# Patient Record
Sex: Male | Born: 1935 | Race: Black or African American | Hispanic: No | Smoking: Former smoker
Health system: Southern US, Community
[De-identification: ages and names within clinical notes are randomized; demographics above are authoritative.]

## PROBLEM LIST (undated history)

## (undated) DIAGNOSIS — N400 Enlarged prostate without lower urinary tract symptoms: Secondary | ICD-10-CM

## (undated) DIAGNOSIS — M1990 Unspecified osteoarthritis, unspecified site: Secondary | ICD-10-CM

## (undated) DIAGNOSIS — I10 Essential (primary) hypertension: Secondary | ICD-10-CM

## (undated) DIAGNOSIS — N19 Unspecified kidney failure: Secondary | ICD-10-CM

## (undated) DIAGNOSIS — E785 Hyperlipidemia, unspecified: Secondary | ICD-10-CM

## (undated) HISTORY — DX: Hyperlipidemia, unspecified: E78.5

## (undated) HISTORY — DX: Unspecified kidney failure: N19

## (undated) HISTORY — DX: Unspecified osteoarthritis, unspecified site: M19.90

## (undated) HISTORY — PX: TOTAL HIP ARTHROPLASTY: SHX124

---

## 2010-06-28 ENCOUNTER — Ambulatory Visit: Payer: Self-pay | Admitting: Unknown Physician Specialty

## 2010-07-12 ENCOUNTER — Inpatient Hospital Stay: Payer: Self-pay | Admitting: Unknown Physician Specialty

## 2010-07-17 ENCOUNTER — Encounter: Payer: Self-pay | Admitting: Internal Medicine

## 2010-08-02 ENCOUNTER — Encounter: Payer: Self-pay | Admitting: Internal Medicine

## 2011-11-21 ENCOUNTER — Emergency Department: Payer: Self-pay | Admitting: Internal Medicine

## 2011-11-21 LAB — COMPREHENSIVE METABOLIC PANEL
Albumin: 3.6 g/dL (ref 3.4–5.0)
Alkaline Phosphatase: 85 U/L (ref 50–136)
Anion Gap: 9 (ref 7–16)
Bilirubin,Total: 1 mg/dL (ref 0.2–1.0)
Chloride: 105 mmol/L (ref 98–107)
Co2: 29 mmol/L (ref 21–32)
Creatinine: 1.13 mg/dL (ref 0.60–1.30)
EGFR (Non-African Amer.): >60
Osmolality: 290 (ref 275–301)
Potassium: 2.9 mmol/L — ABNORMAL LOW (ref 3.5–5.1)
SGOT(AST): 22 U/L (ref 15–37)
Sodium: 143 mmol/L (ref 136–145)
Total Protein: 8.4 g/dL — ABNORMAL HIGH (ref 6.4–8.2)

## 2011-11-21 LAB — URINALYSIS, COMPLETE
Bilirubin,UR: NEGATIVE
Glucose,UR: NEGATIVE mg/dL (ref 0–75)
Nitrite: NEGATIVE
Specific Gravity: 1.015 (ref 1.003–1.030)
Squamous Epithelial: NONE SEEN

## 2011-11-21 LAB — CBC WITH DIFFERENTIAL/PLATELET
Basophil %: 0.1 %
HCT: 42.3 % (ref 40.0–52.0)
HGB: 14.2 g/dL (ref 13.0–18.0)
Lymphocyte #: 0.4 10*3/uL — ABNORMAL LOW (ref 1.0–3.6)
MCH: 30.1 pg (ref 26.0–34.0)
MCHC: 33.7 g/dL (ref 32.0–36.0)
MCV: 90 fL (ref 80–100)
Monocyte %: 4.5 %
Neutrophil #: 7.8 10*3/uL — ABNORMAL HIGH (ref 1.4–6.5)
Neutrophil %: 90.7 %
Platelet: 101 10*3/uL — ABNORMAL LOW (ref 150–440)
RDW: 14.8 % — ABNORMAL HIGH (ref 11.5–14.5)
WBC: 8.6 10*3/uL (ref 3.8–10.6)

## 2011-11-23 LAB — URINE CULTURE

## 2011-11-27 LAB — CULTURE, BLOOD (SINGLE)

## 2012-01-27 ENCOUNTER — Ambulatory Visit: Payer: Self-pay | Admitting: Oncology

## 2012-01-27 LAB — CBC CANCER CENTER
Basophil #: 0 x10 3/mm (ref 0.0–0.1)
Basophil %: 0.4 %
Eosinophil #: 0.3 x10 3/mm (ref 0.0–0.7)
Lymphocyte %: 30.5 %
MCH: 29.4 pg (ref 26.0–34.0)
MCHC: 32.5 g/dL (ref 32.0–36.0)
Neutrophil %: 58 %
Platelet: 95 x10 3/mm — ABNORMAL LOW (ref 150–440)
RDW: 14.8 % — ABNORMAL HIGH (ref 11.5–14.5)

## 2012-01-27 LAB — IRON AND TIBC
Iron: 83 ug/dL (ref 65–175)
Unbound Iron-Bind.Cap.: 227 ug/dL

## 2012-01-27 LAB — FOLATE: Folic Acid: 17.5 ng/mL (ref 3.1–100.0)

## 2012-01-27 LAB — FERRITIN: Ferritin (ARMC): 261 ng/mL (ref 8–388)

## 2012-01-27 LAB — LACTATE DEHYDROGENASE: LDH: 194 U/L (ref 81–234)

## 2012-02-02 ENCOUNTER — Ambulatory Visit: Payer: Self-pay | Admitting: Oncology

## 2012-04-28 ENCOUNTER — Ambulatory Visit: Payer: Self-pay | Admitting: Oncology

## 2012-04-28 LAB — CBC CANCER CENTER
Basophil #: 0.1 x10 3/mm (ref 0.0–0.1)
Basophil %: 0.8 %
Eosinophil #: 0.1 x10 3/mm (ref 0.0–0.7)
HGB: 15.4 g/dL (ref 13.0–18.0)
Lymphocyte %: 39.2 %
MCHC: 35.4 g/dL (ref 32.0–36.0)
MCV: 90 fL (ref 80–100)
Monocyte %: 6.5 %
Neutrophil %: 51.6 %
RBC: 4.83 10*6/uL (ref 4.40–5.90)
RDW: 14.4 % (ref 11.5–14.5)

## 2012-05-03 ENCOUNTER — Ambulatory Visit: Payer: Self-pay | Admitting: Oncology

## 2012-05-06 ENCOUNTER — Emergency Department: Payer: Self-pay | Admitting: Emergency Medicine

## 2012-05-06 LAB — COMPREHENSIVE METABOLIC PANEL
Alkaline Phosphatase: 71 U/L (ref 50–136)
Anion Gap: 10 (ref 7–16)
BUN: 23 mg/dL — ABNORMAL HIGH (ref 7–18)
Bilirubin,Total: 1.2 mg/dL — ABNORMAL HIGH (ref 0.2–1.0)
Calcium, Total: 8.7 mg/dL (ref 8.5–10.1)
Chloride: 108 mmol/L — ABNORMAL HIGH (ref 98–107)
Co2: 23 mmol/L (ref 21–32)
Creatinine: 1.08 mg/dL (ref 0.60–1.30)
EGFR (African American): >60
EGFR (Non-African Amer.): >60
Osmolality: 285 (ref 275–301)
Potassium: 3.1 mmol/L — ABNORMAL LOW (ref 3.5–5.1)
SGPT (ALT): 17 U/L (ref 12–78)
Total Protein: 7.7 g/dL (ref 6.4–8.2)

## 2012-05-06 LAB — CBC
HCT: 40.8 % (ref 40.0–52.0)
HGB: 13.5 g/dL (ref 13.0–18.0)
RBC: 4.54 10*6/uL (ref 4.40–5.90)
WBC: 9.8 10*3/uL (ref 3.8–10.6)

## 2012-05-06 LAB — URINALYSIS, COMPLETE
Bilirubin,UR: NEGATIVE
Glucose,UR: NEGATIVE mg/dL (ref 0–75)
Nitrite: NEGATIVE
Ph: 8 (ref 4.5–8.0)
Specific Gravity: 1.017 (ref 1.003–1.030)

## 2012-05-06 LAB — RAPID INFLUENZA A&B ANTIGENS

## 2012-07-04 ENCOUNTER — Ambulatory Visit: Payer: Self-pay | Admitting: Oncology

## 2012-10-15 ENCOUNTER — Ambulatory Visit: Payer: Self-pay | Admitting: Oncology

## 2012-11-18 ENCOUNTER — Ambulatory Visit: Payer: Self-pay | Admitting: Orthopedic Surgery

## 2012-11-18 LAB — SEDIMENTATION RATE: Erythrocyte Sed Rate: 8 mm/hr (ref 0–20)

## 2012-11-18 LAB — URINALYSIS, COMPLETE
Bilirubin,UR: NEGATIVE
Blood: NEGATIVE
Glucose,UR: NEGATIVE mg/dL (ref 0–75)
Ketone: NEGATIVE
Ph: 6 (ref 4.5–8.0)
RBC,UR: 1 /HPF (ref 0–5)

## 2012-11-18 LAB — BASIC METABOLIC PANEL
Anion Gap: 7 (ref 7–16)
Chloride: 106 mmol/L (ref 98–107)
Co2: 29 mmol/L (ref 21–32)
Creatinine: 1.11 mg/dL (ref 0.60–1.30)
EGFR (African American): >60
EGFR (Non-African Amer.): >60
Glucose: 89 mg/dL (ref 65–99)
Osmolality: 285 (ref 275–301)
Potassium: 3.3 mmol/L — ABNORMAL LOW (ref 3.5–5.1)
Sodium: 142 mmol/L (ref 136–145)

## 2012-11-18 LAB — MRSA PCR SCREENING

## 2012-11-18 LAB — PROTIME-INR
INR: 1
Prothrombin Time: 13.5 secs (ref 11.5–14.7)

## 2012-11-18 LAB — CBC
MCV: 89 fL (ref 80–100)
Platelet: 108 10*3/uL — ABNORMAL LOW (ref 150–440)
RBC: 4.73 10*6/uL (ref 4.40–5.90)
WBC: 5 10*3/uL (ref 3.8–10.6)

## 2012-12-03 ENCOUNTER — Inpatient Hospital Stay: Payer: Self-pay | Admitting: Orthopedic Surgery

## 2012-12-04 LAB — BASIC METABOLIC PANEL
Anion Gap: 9 (ref 7–16)
BUN: 13 mg/dL (ref 7–18)
Co2: 25 mmol/L (ref 21–32)
Creatinine: 1.08 mg/dL (ref 0.60–1.30)
EGFR (Non-African Amer.): >60
Potassium: 3.3 mmol/L — ABNORMAL LOW (ref 3.5–5.1)

## 2012-12-04 LAB — HEMOGLOBIN: HGB: 10.6 g/dL — ABNORMAL LOW (ref 13.0–18.0)

## 2012-12-05 LAB — URINALYSIS, COMPLETE
Glucose,UR: NEGATIVE mg/dL (ref 0–75)
Nitrite: NEGATIVE
Ph: 6 (ref 4.5–8.0)
Protein: 100
Specific Gravity: 1.017 (ref 1.003–1.030)
Squamous Epithelial: 35
WBC UR: 1 /HPF (ref 0–5)

## 2012-12-05 LAB — BASIC METABOLIC PANEL
Anion Gap: 9 (ref 7–16)
BUN: 9 mg/dL (ref 7–18)
Chloride: 106 mmol/L (ref 98–107)
Co2: 26 mmol/L (ref 21–32)
Creatinine: 0.99 mg/dL (ref 0.60–1.30)
EGFR (African American): >60
EGFR (Non-African Amer.): >60
Glucose: 81 mg/dL (ref 65–99)
Osmolality: 279 (ref 275–301)

## 2012-12-05 LAB — HEMOGLOBIN: HGB: 10.4 g/dL — ABNORMAL LOW (ref 13.0–18.0)

## 2012-12-05 LAB — PLATELET COUNT: Platelet: 79 10*3/uL — ABNORMAL LOW (ref 150–440)

## 2012-12-06 LAB — BASIC METABOLIC PANEL
Anion Gap: 9 (ref 7–16)
BUN: 10 mg/dL (ref 7–18)
Chloride: 106 mmol/L (ref 98–107)
Creatinine: 1.01 mg/dL (ref 0.60–1.30)
EGFR (Non-African Amer.): >60
Osmolality: 279 (ref 275–301)
Potassium: 3.2 mmol/L — ABNORMAL LOW (ref 3.5–5.1)

## 2012-12-07 LAB — BASIC METABOLIC PANEL
Anion Gap: 6 — ABNORMAL LOW (ref 7–16)
BUN: 10 mg/dL (ref 7–18)
Calcium, Total: 8.3 mg/dL — ABNORMAL LOW (ref 8.5–10.1)
Co2: 27 mmol/L (ref 21–32)
Creatinine: 0.93 mg/dL (ref 0.60–1.30)
EGFR (African American): >60
EGFR (Non-African Amer.): >60
Glucose: 87 mg/dL (ref 65–99)
Osmolality: 276 (ref 275–301)
Potassium: 3.2 mmol/L — ABNORMAL LOW (ref 3.5–5.1)
Sodium: 139 mmol/L (ref 136–145)

## 2012-12-07 LAB — URINE CULTURE

## 2012-12-08 LAB — PATHOLOGY REPORT

## 2014-08-21 ENCOUNTER — Inpatient Hospital Stay: Payer: Self-pay | Admitting: Internal Medicine

## 2014-08-29 DIAGNOSIS — I639 Cerebral infarction, unspecified: Secondary | ICD-10-CM | POA: Insufficient documentation

## 2014-09-23 NOTE — Discharge Summary (Signed)
PATIENT NAME:  Edwin Sutton, Edwin Sutton MR#:  [id number] DATE OF BIRTH:  [birth year]  DATE OF ADMISSION:  12/03/2012 DATE OF DISCHARGE:  12/07/2012  ADMITTING DIAGNOSIS: Degenerative arthritis, left hip, severe.   DISCHARGE DIAGNOSIS: Degenerative arthritis, left hip, severe.   OPERATION: On 12/03/2012, he had a left total hip arthroplasty.   SURGEON: Dr. Kennedy BuckerMichael Menz.   ANESTHESIA: Spinal.   ESTIMATED BLOOD LOSS: 1000 mL.   DRAINS: Hemovac.   IMPLANTS: Medacta 2 AMIS stem, 56 mm Versa Fit cup with DM liner, L 28 mm head.   COMPLICATIONS: None.   HISTORY: Mr. Edwin Sutton is an 81 year old African American male with progressive left hip pain. He failed conservative measures and consented for total hip arthroplasty.   PHYSICAL EXAMINATION:  GENERAL: Well-developed, well-nourished. Alert and oriented x 3. No acute distress.  NEUROLOGIC: Sensation intact to left lower extremity.  HEENT: Normocephalic, atraumatic. PERRLA. Mallampati score 3. No dentures or partials. He has poor neck extension and side flexion.  CARDIOVASCULAR: No edema or effusion.  HEART: Regular rate and rhythm. No murmurs.  RESPIRATORY: Has not had good air movement throughout the entire right lung. He has difficulty taking a deep breath. He has good air movement left lung and lung sounds are clear.  ABDOMEN: No hepatomegaly. No splenomegaly. Soft. Nontender to palpation throughout the entire abdomen and positive bowel sounds in all 4 quadrants.  EXTREMITIES: Mildly antalgic gait favoring left leg, using a cane in the right upper extremity. He is nontender to palpation throughout the left hip. He has 3 degrees of external rotation and 0 degrees of internal rotation. Left hip flexion to 60 and painful. Left knee range of motion is equal, full and painless.  SKIN: No scars, rashes or lesions.   HOSPITAL COURSE: After initial admission on 12/03/2012, he underwent left total hip arthroplasty. On postoperative day 1, 12/04/2012, his  hemoglobin was 10.6. Physical therapy was begun on that day. His potassium was 3.3 and so he was given potassium replacement. On postoperative day 2, 12/05/2012, he continued to progress with therapy. Hemoglobin was 10.4. Repeat potassium was 3.1. He was given a potassium replacement IV and continued to progress with therapy. On postoperative day 3, 12/06/2012, his potassium was 3.2. He had 1+ bacteria on a urinalysis. He also had a fever with etiology of possible atelectasis versus UTI. He did progress with therapy, but this was slow. Levaquin was started for presumed UTI. He was also continued on potassium replacement. On postoperative day 4, 12/07/2012, he was stable and ready for discharge. T-max 100.3. He was on contact precautions for positive MRSA on nasal swab preop.   CONDITION AT DISCHARGE: Stable.   DISPOSITION: The patient was sent to rehab.   DISCHARGE INSTRUCTIONS:  1.  The patient will [facility] 2 weeks for staple removal.  2.  He will do physical therapy and may be weight-bearing as tolerated on the left lower extremity.  3.  He will use thigh-high TED hose bilaterally.  4.  Dressing can be changed once daily and on an as-needed basis.  5.  Please see discharge instructions for a complete medication list. ____________________________ Kaede Clendenen M. Haskel KhanBerndt, NP amb:aw D: 12/15/2012 07:43:58 ET T: 12/15/2012 08:45:55 ET JOB#: [id number]  cc: Donat Humble M. Haskel KhanBerndt, NP, <Dictator> Burt EkAPRIL M Arizona Sorn FNP ELECTRONICALLY SIGNED 12/24/2012 11:41

## 2014-09-23 NOTE — Op Note (Signed)
PATIENT NAME:  Edwin Sutton, Edwin Sutton MR#:  [id number] DATE OF BIRTH:  07-06-1928  DATE OF PROCEDURE:  12/03/2012  POSTOPERATIVE DIAGNOSIS: Severe left hip osteoarthritis.   POSTOPERATIVE DIAGNOSIS: Severe left hip osteoarthritis.   PROCEDURE: Left total hip replacement.   ANESTHESIA: Spinal.   SURGEON: Leitha SchullerMichael J. Turkessa Ostrom, M.D.   ASSISTANT: April Berndt, NP.   DESCRIPTION OF PROCEDURE: The patient was brought to the operating room and after adequate spinal anesthesia was obtained, the patient was placed on the operative table with a Medacta attachment, right leg in the well leg holder. C-arm was brought in with good visualization of the left hip and preop template x-ray printed. The hip was then prepped and draped in the usual sterile fashion with appropriate patient identification and timeout procedures completed. Anterior approach to the hip was made centered over the greater trochanter. The incision down through the skin and subcutaneous tissue was carried out and tensor fascia lata was incised and the muscle retracted laterally. The rectus sheath was entered and the rectus retracted medially. Anterior capsulotomy was carried out and the hip was noted to be very stiff. It was very difficult to get any external rotation. The femoral neck cut was made with a napkin ring second cut to remove a section and the head would not out since it was so stiff and was reamed with sequential reamers to get just a thin edge of the head remaining. This was removed and sent as specimen showing severe degenerative change. The labrum was excised and sequential reaming of the acetabulum was carried out getting good bleeding bone. A 56 mm trial fit well and the 56 cup was impacted into position and fit well. The leg was externally rotated at this time and there was severe stiffness to the hip. Capsular release was carried out and this gave adequate mobility to allow for extension of the leg in adduction. Sequential broaching was  carried out and a 2 stem gave nice fit. With trials, intraoperative comparison to prior initial x-ray was made and the #2 stem was impacted with a size L femoral head and a 56 DM liner. The head was assembled on the back table and after the stem was impacted, the head was placed and the hip reduced. It was very stable. The wound was thoroughly irrigated and then closed with heavy quill suture for the deep fascia, 2-0 quill subcutaneously with a subcutaneous drain and skin staples.   ESTIMATED BLOOD LOSS: 1000 mL.   COMPLICATIONS: There were no complications.   SPECIMEN: Removed femoral head.   IMPLANTS: Versafit cup DM size 56 mm with a size L 28 femoral head, a size 2 standard AMIS stem  and a 28 mm internal diameter dual mobility liner.   CONDITION TO RECOVERY: Stable. ____________________________ Leitha SchullerMichael J. Ivet Guerrieri, MD mjm:aw D: 12/03/2012 10:36:04 ET T: 12/03/2012 10:56:14 ET JOB#: [id number]  cc: Leitha SchullerMichael J. Azeem Poorman, MD, <Dictator> Leitha SchullerMICHAEL J Alexandro Line MD ELECTRONICALLY SIGNED 12/03/2012 13:51

## 2014-10-02 NOTE — H&P (Signed)
PATIENT NAME:  Edwin Sutton, Edwin Sutton MR#:  [id number] DATE OF BIRTH:  25-May-1929  DATE OF ADMISSION:  08/21/2014  PRIMARY CARE PHYSICIAN: John B. Danne Harbor, MD  CHIEF COMPLAINT: Right arm pain.   HISTORY OF PRESENT ILLNESS: This is an 81 year old man who developed right arm pain starting last night. Still had it a little bit this morning, but the pain is better now. Came into the ER for further evaluation. The patient had an MRI of the brain, which showed an acute nonhemorrhagic left hemispheric infarction affecting the posterior frontal cortex. Hospitalist services were contacted for further evaluation for stroke. The patient does not have any other symptoms or any other complaints at this time. States that his strength in the right arm is okay, may be a little bit weaker than the left arm.   PAST MEDICAL HISTORY: Hypertension, swelling, thrombocytopenia.   PAST SURGICAL HISTORY: Bilateral hip replacement.   ALLERGIES: No known drug allergies.   MEDICATIONS: Include acetaminophen 500 mg every 4 hours as needed for pain or temperature, amlodipine 5 mg daily, aspirin 81 mg daily, benazepril 40 mg daily, hydrochlorothiazide 25 mg daily, potassium chloride 10 mEq daily.   SOCIAL HISTORY: No smoking. No alcohol. No drug use. He has children that live with him.   FAMILY HISTORY: Unknown; parents died when he was young.   REVIEW OF SYSTEMS:  CONSTITUTIONAL: Positive for weight loss. Positive for weakness, right arm. No fever, chills, or sweats.  EYES: No blurry vision.  EARS, NOSE, MOUTH AND THROAT: Decreased hearing. No sore throat. No difficulty swallowing.  CARDIOVASCULAR: No chest pain. No palpitations.  RESPIRATORY: Occasional shortness of breath. No cough. No sputum. No hemoptysis.  GASTROINTESTINAL: No nausea. No vomiting. No abdominal pain. No diarrhea. No constipation. No bright red blood per rectum. No melena.  GENITOURINARY: No burning on urination or hematuria.  MUSCULOSKELETAL: Positive  for right arm pain.  INTEGUMENT: No rashes or eruptions.  NEUROLOGIC: No fainting or blackouts.  PSYCHIATRIC: No anxiety or depression.  ENDOCRINE: No thyroid problems.  HEMATOLOGIC AND LYMPHATIC: No anemia, no easy bruising or bleeding.   PHYSICAL EXAMINATION: VITAL SIGNS: Temperature 98.2, pulse 84, respirations 18, blood pressure 156/88, pulse oximetry 99% on room air.  GENERAL: No respiratory distress.  EYES: Conjunctivae and lids normal. Pupils equal, round, and reactive to light. Extraocular muscles intact. No nystagmus.  EARS, NOSE, MOUTH AND THROAT: Tympanic membranes, no erythema. Nasal mucosa: No erythema. Throat: No erythema. No exudate seen. Lips and gums: No lesions.  NECK: No JVD. No bruits. No lymphadenopathy. No thyromegaly. No thyroid nodules palpated.  LUNGS: Clear to auscultation. No use of accessory muscles to breathe. No rhonchi, rales or wheeze heard.  CARDIOVASCULAR: S1, S2 normal. No gallops, rubs, or murmurs heard. Carotid upstroke 2+ bilaterally. No bruits. Pedis pulses 2+ bilaterally and 2+ edema of the lower extremities.  ABDOMEN: Soft, nontender. No organomegaly/splenomegaly. Normoactive bowel sounds. No masses felt.  LYMPHATIC: No lymph nodes in the neck.  MUSCULOSKELETAL: Clubbing. Positive 2+ edema. No cyanosis.  SKIN: No ulcers or lesions.  NEUROLOGIC: Cranial nerves II through XII grossly intact. Deep tendon reflexes 2+ bilateral lower extremities. Babinski equivocal bilaterally. Power: Bilateral lower extremities 5/5. Right upper extremity: 4+/5; left upper extremity 5/5. Finger-nose intact bilaterally. Coordination right hand a little bit slower than the left.  PSYCHIATRIC: The patient is alert, oriented to person, place and time.   LABORATORY AND RADIOLOGICAL DATA: Urinalysis 1+ leukocyte esterase. Troponin negative. Glucose 108, BUN 14, creatinine 1.01, sodium 139, potassium 3.3,  chloride 103, CO2 of 28, calcium 9.1. Liver function tests normal range. PT,  INR and PTT normal range. White blood cell count 5.5, hemoglobin and hematocrit 15.1 and 46.6, platelet count of 107,000.   EKG: Normal sinus rhythm, 75 beats per minute. No acute ST-T wave changes. CT scan of the head: No acute intracranial abnormality. Chest x-ray: No acute cardiopulmonary disease. MRI of the brain showed an acute nonhemorrhagic left hemisphere infarction involving the posterior frontal cortex.   ASSESSMENT AND PLAN: 1. Acute cerebrovascular accident with right arm weakness and pain. Since the patient is on aspirin at home, I will take a step up in treatment with Plavix, add 40 mg of Lipitor, check a lipid profile in the a.m. I will monitor on telemetry, get an echocardiogram, carotid ultrasound, physical therapy and occupational therapy consultations and continue to monitor.  2. Hypertension. Blood pressure on the lower side. We will discontinue hydrochlorothiazide and continue his other 2 blood pressure medications and monitor.  3. Thrombocytopenia. This looks chronic. He has seen Dr. Orlie DakinFinnegan in the past. I will check a hepatitis C level.  4. Edema of the lower extremities. Continue to monitor.   TIME SPENT ON ADMISSION: 50 minutes.   CODE STATUS: The patient is a full code.     ____________________________ Herschell Dimesichard J. Renae GlossWieting, MD rjw:TT D: 08/21/2014 15:10:24 ET T: 08/21/2014 15:41:25 ET JOB#: [id number]  cc: Herschell Dimesichard J. Renae GlossWieting, MD, <Dictator> John B. Danne HarborWalker III, MD Salley ScarletICHARD J Jannat Rosemeyer MD ELECTRONICALLY SIGNED 08/23/2014 17:28

## 2014-10-02 NOTE — Discharge Summary (Signed)
PATIENT NAME:  [name], Edwin Sutton, Edwin  DATE OF ADMISSION:  08/21/2014 DATE OF DISCHARGE:  08/22/2014  PRESENTING COMPLAINT: Right arm weakness.   DISCHARGE DIAGNOSES:  1.  Acute left frontal cortex stroke.  2.  Hypertension.   CONDITION ON DISCHARGE: Fair.   CODE STATUS: Full.   MEDICATIONS:  1.  Plavix 75 mg p.o. daily.  2.  Aspirin 81 mg daily.  3.  Potassium chloride 10 mEq p.o. daily.  4.  Tylenol 500 one tablet 4 times a day as needed.  5.  Amlodipine 5 mg daily.  6.  Hydrochlorothiazide 25 mg daily.  7.  Benazepril 40 mg p.o. daily.   Physical therapy, occupational therapy, and home health arranged.   FOLLOWUP: With Dr. [facility] in 2-4 weeks.   LABORATORY DATA:   1.  Carotid Doppler essentially negative for any carotid stenosis.  2.  Echo Doppler showed ejection fraction of 55-60%, normal global left ventricular systolic function, mitral valve regurgitation, mild aortic regurgitation. Severely increased left ventricular posterior wall thickness. Mild tricuspid regurgitation.  3.  Platelet count is 106. The rest of the basic metabolic panel within normal limits except potassium of 3.1, that was replaced. Lipid profile within normal limits.  4.  Hepatitis C virus antibody is negative. Troponin x 3 negative.  4.  MRI of the brain showed acute, nonhemorrhagic, left hemispheric infarction involving the posterior frontal cortex near the vertex.   BRIEF SUMMARY OF HOSPITAL COURSE:  Mr. [name] is an 81 year old, very pleasant, African American gentleman with past medical history of hypertension, comes in with right upper extremity weakness. He was admitted with:  1.  Acute CVA with right arm weakness. He was on aspirin at home and added Plavix. The patient was also started on p.o. Lipitor. A carotid ultrasound did not show any stenosis. MRI was positive for stroke as dictated above. PT, OT was arranged at home. The patient  remained in sinus rhythm on telemetry.  2.  Hypertension. Resumed all blood pressure medications.  3.  Thrombocytopenia, chronic. He has followed Dr. Orlie DakinFinnegan in the past.   Hospital stay otherwise remained stable.  The patient remained a full code.   TIME SPENT: 40 minutes.    ____________________________ Wylie HailSona A. Allena KatzPatel, MD sap:sp D: 08/23/2014 07:28:37 ET T: 08/23/2014 09:47:47 ET JOB#: [id number]  cc: Jaime Grizzell A. Allena KatzPatel, MD, <Dictator> John B. Danne HarborWalker III, MD Willow OraSONA A Deon Ivey MD ELECTRONICALLY SIGNED 09/05/2014 12:17

## 2015-01-16 DIAGNOSIS — D696 Thrombocytopenia, unspecified: Secondary | ICD-10-CM | POA: Insufficient documentation

## 2015-05-13 ENCOUNTER — Emergency Department: Payer: Commercial Managed Care - HMO

## 2015-05-13 ENCOUNTER — Encounter: Payer: Self-pay | Admitting: *Deleted

## 2015-05-13 ENCOUNTER — Emergency Department
Admission: EM | Admit: 2015-05-13 | Discharge: 2015-05-13 | Disposition: A | Discharge status: Home / Self Care | Payer: Commercial Managed Care - HMO | Attending: Emergency Medicine | Admitting: Emergency Medicine

## 2015-05-13 DIAGNOSIS — Z79899 Other long term (current) drug therapy: Secondary | ICD-10-CM | POA: Insufficient documentation

## 2015-05-13 DIAGNOSIS — N390 Urinary tract infection, site not specified: Secondary | ICD-10-CM | POA: Insufficient documentation

## 2015-05-13 DIAGNOSIS — R4182 Altered mental status, unspecified: Secondary | ICD-10-CM | POA: Diagnosis not present

## 2015-05-13 DIAGNOSIS — I10 Essential (primary) hypertension: Secondary | ICD-10-CM | POA: Insufficient documentation

## 2015-05-13 DIAGNOSIS — Z7982 Long term (current) use of aspirin: Secondary | ICD-10-CM | POA: Insufficient documentation

## 2015-05-13 DIAGNOSIS — R441 Visual hallucinations: Secondary | ICD-10-CM | POA: Diagnosis not present

## 2015-05-13 DIAGNOSIS — R443 Hallucinations, unspecified: Secondary | ICD-10-CM | POA: Diagnosis present

## 2015-05-13 HISTORY — DX: Essential (primary) hypertension: I10

## 2015-05-13 LAB — CBC
HCT: 41.7 % (ref 40.0–52.0)
Hemoglobin: 14 g/dL (ref 13.0–18.0)
MCH: 30 pg (ref 26.0–34.0)
MCHC: 33.5 g/dL (ref 32.0–36.0)
MCV: 89.7 fL (ref 80.0–100.0)
PLATELETS: 143 10*3/uL — AB (ref 150–440)
RBC: 4.65 MIL/uL (ref 4.40–5.90)
RDW: 14.1 % (ref 11.5–14.5)
WBC: 5.9 10*3/uL (ref 3.8–10.6)

## 2015-05-13 LAB — GLUCOSE, CAPILLARY: GLUCOSE-CAPILLARY: 94 mg/dL (ref 65–99)

## 2015-05-13 LAB — URINE DRUG SCREEN, QUALITATIVE (ARMC ONLY)
AMPHETAMINES, UR SCREEN: NOT DETECTED
BENZODIAZEPINE, UR SCRN: NOT DETECTED
Barbiturates, Ur Screen: NOT DETECTED
CANNABINOID 50 NG, UR ~~LOC~~: NOT DETECTED
Cocaine Metabolite,Ur ~~LOC~~: NOT DETECTED
MDMA (ECSTASY) UR SCREEN: NOT DETECTED
Methadone Scn, Ur: NOT DETECTED
Opiate, Ur Screen: NOT DETECTED
PHENCYCLIDINE (PCP) UR S: NOT DETECTED
TRICYCLIC, UR SCREEN: NOT DETECTED

## 2015-05-13 LAB — URINALYSIS COMPLETE WITH MICROSCOPIC (ARMC ONLY)
BACTERIA UA: NONE SEEN
Bilirubin Urine: NEGATIVE
Glucose, UA: NEGATIVE mg/dL
Hgb urine dipstick: NEGATIVE
KETONES UR: NEGATIVE mg/dL
Nitrite: NEGATIVE
PH: 5 (ref 5.0–8.0)
PROTEIN: NEGATIVE mg/dL
SPECIFIC GRAVITY, URINE: 1.016 (ref 1.005–1.030)

## 2015-05-13 LAB — ACETAMINOPHEN LEVEL

## 2015-05-13 LAB — COMPREHENSIVE METABOLIC PANEL
ALK PHOS: 51 U/L (ref 38–126)
ALT: 11 U/L — AB (ref 17–63)
AST: 22 U/L (ref 15–41)
Albumin: 3.7 g/dL (ref 3.5–5.0)
Anion gap: 10 (ref 5–15)
BILIRUBIN TOTAL: 0.8 mg/dL (ref 0.3–1.2)
BUN: 21 mg/dL — AB (ref 6–20)
CALCIUM: 9.3 mg/dL (ref 8.9–10.3)
CHLORIDE: 102 mmol/L (ref 101–111)
CO2: 24 mmol/L (ref 22–32)
CREATININE: 1.39 mg/dL — AB (ref 0.61–1.24)
GFR, EST AFRICAN AMERICAN: 52 mL/min — AB (ref >60)
GFR, EST NON AFRICAN AMERICAN: 45 mL/min — AB (ref >60)
Glucose, Bld: 107 mg/dL — ABNORMAL HIGH (ref 65–99)
Potassium: 3.3 mmol/L — ABNORMAL LOW (ref 3.5–5.1)
Sodium: 136 mmol/L (ref 135–145)
TOTAL PROTEIN: 8 g/dL (ref 6.5–8.1)

## 2015-05-13 LAB — SALICYLATE LEVEL

## 2015-05-13 LAB — TROPONIN I: Troponin I: <0.03 ng/mL (ref <0.031)

## 2015-05-13 MED ORDER — SODIUM CHLORIDE 0.9 % IV BOLUS (SEPSIS)
1000.0000 mL | Freq: Once | INTRAVENOUS | Status: AC
  Administered 2015-05-13: 1000 mL via INTRAVENOUS

## 2015-05-13 MED ORDER — CEPHALEXIN 500 MG PO CAPS: 500.0000 mg | ORAL_CAPSULE | Freq: Four times a day (QID) | ORAL | Status: DC

## 2015-05-13 MED ORDER — DEXTROSE 5 % IV SOLN
1.0000 g | INTRAVENOUS | Status: DC
  Administered 2015-05-13: 1 g via INTRAVENOUS
  Filled 2015-05-13: qty 10

## 2015-05-13 NOTE — ED Notes (Signed)
EKG delayed due to pt transported to head CT

## 2015-05-13 NOTE — ED Provider Notes (Signed)
[facility] Emergency Department Provider Note  ____________________________________________  Time seen: Approximately 2:10 AM  I have reviewed the triage vital signs and the nursing notes.   HISTORY  Chief Complaint Hallucinations  History obtained by patient and his daughters  HPI Johneric Mcfadden is a 81 y.o. male who presents to the ED from home with a chief complain of hallucinations.Daughter states patient "has not been acting right" since 5 AM. Their brother, who lives with the patient, all them to say that the patient was hallucinating people in his house trying to hurt him and robbed him. Symptoms apparently worsened over the course of the day. When the daughters came to the house this evening, they found the patient around the back of the house searching for people who he thinks have been in his house. Daughters note that patient started Septra 5 days ago. Unsure what infection his doctor is treating. Daughters deny history of dementia or mental illness. Denies recent fever, chills, chest pain, shortness of breath, abdominal pain, nausea, vomiting, diarrhea. Nothing makes his symptoms better or worse.   Past Medical History  Diagnosis Date  . Hypertension     There are no active problems to display for this patient.   History reviewed. No pertinent past surgical history.  Current Outpatient Rx  Name  Route  Sig  Dispense  Refill  . acetaminophen (TYLENOL) 500 MG tablet   Oral   Take 500 mg by mouth every 6 (six) hours as needed.         Marland Kitchen amLODipine (NORVASC) 5 MG tablet   Oral   Take 5 mg by mouth daily.         Marland Kitchen aspirin EC 81 MG tablet   Oral   Take 81 mg by mouth daily.         . benazepril (LOTENSIN) 40 MG tablet   Oral   Take 40 mg by mouth daily.         . hydrochlorothiazide (HYDRODIURIL) 25 MG tablet   Oral   Take 25 mg by mouth daily.         . metoprolol tartrate (LOPRESSOR) 25 MG tablet   Oral   Take 25 mg by  mouth 2 (two) times daily.         . potassium chloride (K-DUR) 10 MEQ tablet   Oral   Take 10 mEq by mouth daily.         Marland Kitchen sulfamethoxazole-trimethoprim (BACTRIM DS,SEPTRA DS) 800-160 MG tablet   Oral   Take 1 tablet by mouth 2 (two) times daily.           Allergies Review of patient's allergies indicates no known allergies.  History reviewed. No pertinent family history.  Social History Social History  Substance Use Topics  . Smoking status: Never Smoker   . Smokeless tobacco: None  . Alcohol Use: No    Review of Systems Constitutional: No fever/chills Eyes: No visual changes. ENT: No sore throat. Cardiovascular: Denies chest pain. Respiratory: Denies shortness of breath. Gastrointestinal: No abdominal pain.  No nausea, no vomiting.  No diarrhea.  No constipation. Genitourinary: Negative for dysuria. Musculoskeletal: Negative for back pain. Skin: Negative for rash. Neurological: Negative for headaches, focal weakness or numbness. Psychiatric:Positive for visual hallucinations.  10-point ROS otherwise negative.  ____________________________________________   PHYSICAL EXAM:  VITAL SIGNS: ED Triage Vitals  Enc Vitals Group     BP 05/13/15 0101 107/60 mmHg     Pulse Rate 05/13/15 0101 78  Resp 05/13/15 0101 18     Temp 05/13/15 0101 98.4 F (36.9 C)     Temp Source 05/13/15 0101 Oral     SpO2 05/13/15 0101 96 %     Weight 05/13/15 0101 194 lb (87.998 kg)     Height 05/13/15 0101 6' (1.829 m)     Head Cir --      Peak Flow --      Pain Score 05/13/15 0205 0     Pain Loc --      Pain Edu? --      Excl. in GC? --     Constitutional: Alert and oriented. Well appearing and in no acute distress. Eyes: Conjunctivae are normal. PERRL. EOMI. Head: Atraumatic. Nose: No congestion/rhinnorhea. Mouth/Throat: Mucous membranes are moist.  Oropharynx non-erythematous. Neck: No stridor.  No carotid bruits. Hematological/Lymphatic/Immunilogical: No  cervical lymphadenopathy. Cardiovascular: Normal rate, regular rhythm. Grossly normal heart sounds.  Good peripheral circulation. Respiratory: Normal respiratory effort.  No retractions. Lungs CTAB. Gastrointestinal: Soft and nontender. No distention. No abdominal bruits. No CVA tenderness. Musculoskeletal: No lower extremity tenderness nor edema.  No joint effusions. Neurologic:  Alert and oriented to person and place. Normal speech and language. No gross focal neurologic deficits are appreciated. No gait instability. Skin:  Skin is warm, dry and intact. No rash noted. Psychiatric: Mood and affect are normal. Speech and behavior are normal.  ____________________________________________   LABS (all labs ordered are listed, but only abnormal results are displayed)  Labs Reviewed  COMPREHENSIVE METABOLIC PANEL - Abnormal; Notable for the following:    Potassium 3.3 (*)    Glucose, Bld 107 (*)    BUN 21 (*)    Creatinine, Ser 1.39 (*)    ALT 11 (*)    GFR calc non Af Amer 45 (*)    GFR calc Af Amer 52 (*)    All other components within normal limits  CBC - Abnormal; Notable for the following:    Platelets 143 (*)    All other components within normal limits  ACETAMINOPHEN LEVEL - Abnormal; Notable for the following:    Acetaminophen (Tylenol), Serum <10 (*)    All other components within normal limits  URINALYSIS COMPLETEWITH MICROSCOPIC ([facility] ONLY) - Abnormal; Notable for the following:    Color, Urine YELLOW (*)    APPearance CLOUDY (*)    Leukocytes, UA 1+ (*)    Squamous Epithelial / LPF 0-5 (*)    All other components within normal limits  GLUCOSE, CAPILLARY  TROPONIN I  SALICYLATE LEVEL  URINE DRUG SCREEN, QUALITATIVE ([facility] ONLY)  CBG MONITORING, ED   ____________________________________________  EKG  ED ECG REPORT I, SUNG,JADE J, the attending physician, personally viewed and interpreted this ECG.   Date: 05/13/2015  EKG Time: 0242  Rate: 72  Rhythm: normal  EKG, normal sinus rhythm  Axis: Normal  Intervals:none  ST&T Change: Nonspecific  ____________________________________________  RADIOLOGY  Head without contrast interpreted per Dr. Cherly Hensen: 1. No acute intracranial pathology seen on CT. 2. Mild cortical volume loss and scattered small vessel ischemic microangiopathy. 3. Mild chronic ischemic change at the anterior limb of the internal capsule bilaterally.  Portable chest x-ray (viewed by me, interpreted per Dr. Cherly Hensen): Elevation of the right hemidiaphragm. Lungs remain grossly clear.  ____________________________________________   PROCEDURES  Procedure(s) performed: None  Critical Care performed: No  ____________________________________________   INITIAL IMPRESSION / ASSESSMENT AND PLAN / ED COURSE  Pertinent labs & imaging results that were available during my care  of the patient were reviewed by me and considered in my medical decision making (see chart for details).  81 year old male who presents with paranoid hallucinations. Recently started on Septra for unknown infection. Consider delirium versus dementia. Will initiate medical evaluation with screening lab work, urinalysis, CT head and chest x-ray. Discussed with patient and daughters that if these tests are unremarkable, then we will proceed with geriatric psychiatric evaluation.  ----------------------------------------- 4:47 AM on 05/13/2015 -----------------------------------------  Patient has been calm and cooperative during his ED course. Daughters state his sensorium seems to be improving. Found to have UTI; IV Rocephin given. Will change Septra to Keflex and patient will follow-up with his PCP. I did offer again to patient's daughters a geriatric psych evaluation. They declined and wish to proceed with discharge home. Strict return precautions given. All verbalize understanding and agree with plan of  care. ____________________________________________   FINAL CLINICAL IMPRESSION(S) / ED DIAGNOSES  Final diagnoses:  Visual hallucinations  Altered mental status, unspecified altered mental status type  UTI (lower urinary tract infection)      Irean HongJade J Sung, MD 05/13/15 [phone]

## 2015-05-13 NOTE — ED Notes (Signed)
Patient transported to CT 

## 2015-05-13 NOTE — ED Notes (Signed)
MD sung at bedside. 

## 2015-05-13 NOTE — ED Notes (Signed)
Lab called for add of blood work

## 2015-05-13 NOTE — Discharge Instructions (Signed)
1. Discontinue Septra antibiotic. 2. Start Keflex 500 mg 4 times daily 7 days. This antibiotic is to treat urinary tract infection. 3. Return to the ER for worsening symptoms, fever, persistent vomiting, bizarre or aggressive behavior, or other concerns.  Confusion Confusion is the inability to think with your usual speed or clarity. Confusion may come on quickly or slowly over time. How quickly the confusion comes on depends on the cause. Confusion can be due to any number of causes. CAUSES   Concussion, head injury, or head trauma.  Seizures.  Stroke.  Fever.  Brain tumor.  Age related decreased brain function (dementia).  Heightened emotional states like rage or terror.  Mental illness in which the person loses the ability to determine what is real and what is not (hallucinations).  Infections such as a urinary tract infection (UTI).  Toxic effects from alcohol, drugs, or prescription medicines.  Dehydration and an imbalance of salts in the body (electrolytes).  Lack of sleep.  Low blood sugar (diabetes).  Low levels of oxygen from conditions such as chronic lung disorders.  Drug interactions or other medicine side effects.  Nutritional deficiencies, especially niacin, thiamine, vitamin C, or vitamin B.  Sudden drop in body temperature (hypothermia).  Change in routine, such as when traveling or hospitalized. SIGNS AND SYMPTOMS  People often describe their thinking as cloudy or unclear when they are confused. Confusion can also include feeling disoriented. That means you are unaware of where or who you are. You may also not know what the date or time is. If confused, you may also have difficulty paying attention, remembering, and making decisions. Some people also act aggressively when they are confused.  DIAGNOSIS  The medical evaluation of confusion may include:  Blood and urine tests.  X-rays.  Brain and nervous system tests.  Analyzing your brain waves  (electroencephalogram or EEG).  Magnetic resonance imaging (MRI) of your head.  Computed tomography (CT) scan of your head.  Mental status tests in which your health care provider may ask many questions. Some of these questions may seem silly or strange, but they are a very important test to help diagnose and treat confusion. TREATMENT  An admission to the hospital may not be needed, but a person with confusion should not be left alone. Stay with a family member or friend until the confusion clears. Avoid alcohol, pain relievers, or sedative drugs until you have fully recovered. Do not drive until directed by your health care provider. HOME CARE INSTRUCTIONS  What family and friends can do:  To find out if someone is confused, ask the person to state his or her name, age, and the date. If the person is unsure or answers incorrectly, he or she is confused.  Always introduce yourself, no matter how well the person knows you.  Often remind the person of his or her location.  Place a calendar and clock near the confused person.  Help the person with his or her medicines. You may want to use a pill box, an alarm as a reminder, or give the person each dose as prescribed.  Talk about current events and plans for the day.  Try to keep the environment calm, quiet, and peaceful.  Make sure the person keeps follow-up visits with his or her health care provider. PREVENTION  Ways to prevent confusion:  Avoid alcohol.  Eat a balanced diet.  Get enough sleep.  Take medicine only as directed by your health care provider.  Do not become  isolated. Spend time with other people and make plans for your days.  Keep careful watch on your blood sugar levels if you are diabetic. SEEK IMMEDIATE MEDICAL CARE IF:   You develop severe headaches, repeated vomiting, seizures, blackouts, or slurred speech.  There is increasing confusion, weakness, numbness, restlessness, or personality changes.  You  develop a loss of balance, have marked dizziness, feel uncoordinated, or fall.  You have delusions, hallucinations, or develop severe anxiety.  Your family members think you need to be rechecked.   This information is not intended to replace advice given to you by your health care provider. Make sure you discuss any questions you have with your health care provider.   Document Released: 06/27/2004 Document Revised: 06/10/2014 Document Reviewed: 06/25/2013 Elsevier Interactive Patient Education 2016 [employer]  Urinary Tract Infection Urinary tract infections (UTIs) can develop anywhere along your urinary tract. Your urinary tract is your body's drainage system for removing wastes and extra water. Your urinary tract includes two kidneys, two ureters, a bladder, and a urethra. Your kidneys are a pair of bean-shaped organs. Each kidney is about the size of your fist. They are located below your ribs, one on each side of your spine. CAUSES Infections are caused by microbes, which are microscopic organisms, including fungi, viruses, and bacteria. These organisms are so small that they can only be seen through a microscope. Bacteria are the microbes that most commonly cause UTIs. SYMPTOMS  Symptoms of UTIs may vary by age and gender of the patient and by the location of the infection. Symptoms in young women typically include a frequent and intense urge to urinate and a painful, burning feeling in the bladder or urethra during urination. Older women and men are more likely to be tired, shaky, and weak and have muscle aches and abdominal pain. A fever may mean the infection is in your kidneys. Other symptoms of a kidney infection include pain in your back or sides below the ribs, nausea, and vomiting. DIAGNOSIS To diagnose a UTI, your caregiver will ask you about your symptoms. Your caregiver will also ask you to provide a urine sample. The urine sample will be tested for bacteria and white blood  cells. White blood cells are made by your body to help fight infection. TREATMENT  Typically, UTIs can be treated with medication. Because most UTIs are caused by a bacterial infection, they usually can be treated with the use of antibiotics. The choice of antibiotic and length of treatment depend on your symptoms and the type of bacteria causing your infection. HOME CARE INSTRUCTIONS  If you were prescribed antibiotics, take them exactly as your caregiver instructs you. Finish the medication even if you feel better after you have only taken some of the medication.  Drink enough water and fluids to keep your urine clear or pale yellow.  Avoid caffeine, tea, and carbonated beverages. They tend to irritate your bladder.  Empty your bladder often. Avoid holding urine for long periods of time.  Empty your bladder before and after sexual intercourse.  After a bowel movement, women should cleanse from front to back. Use each tissue only once. SEEK MEDICAL CARE IF:   You have back pain.  You develop a fever.  Your symptoms do not begin to resolve within 3 days. SEEK IMMEDIATE MEDICAL CARE IF:   You have severe back pain or lower abdominal pain.  You develop chills.  You have nausea or vomiting.  You have continued burning or discomfort with urination.  MAKE SURE YOU:   Understand these instructions.  Will watch your condition.  Will get help right away if you are not doing well or get worse.   This information is not intended to replace advice given to you by your health care provider. Make sure you discuss any questions you have with your health care provider.   Document Released: 02/27/2005 Document Revised: 02/08/2015 Document Reviewed: 06/28/2011 Elsevier Interactive Patient Education [employer]

## 2015-05-13 NOTE — ED Notes (Signed)
Per family pt has been hallucinating someone robbing him in his home since this morning. Pt A&O, vs stable. Pt denies SI or HI at this time. Denies any pain. Per family pt has loss of appetite. Pt is newly on new med, bactrim unknown why per family.

## 2015-05-13 NOTE — ED Notes (Signed)
Son with whom pt lives states that his father was hallucinating this morning, saying that there were people trying to hurt him and rob him. Son states worsening over the course of the day. Pt admits to hallucinations denies other sxs of pain, nausea, fever. Pt has started taking a new med x 2-3 days ago. Daughter who takes care of meds is not present.

## 2015-05-15 LAB — URINE CULTURE: SPECIAL REQUESTS: NORMAL

## 2015-05-17 DIAGNOSIS — E782 Mixed hyperlipidemia: Secondary | ICD-10-CM | POA: Insufficient documentation

## 2015-05-17 DIAGNOSIS — I480 Paroxysmal atrial fibrillation: Secondary | ICD-10-CM | POA: Insufficient documentation

## 2015-05-24 DIAGNOSIS — I10 Essential (primary) hypertension: Secondary | ICD-10-CM | POA: Insufficient documentation

## 2015-06-01 DIAGNOSIS — R0681 Apnea, not elsewhere classified: Secondary | ICD-10-CM | POA: Insufficient documentation

## 2015-06-19 ENCOUNTER — Encounter: Payer: Self-pay | Admitting: Emergency Medicine

## 2015-06-19 ENCOUNTER — Inpatient Hospital Stay
Admission: EM | Admit: 2015-06-19 | Discharge: 2015-06-26 | DRG: 682 | Disposition: A | Discharge status: Home Health | Payer: Commercial Managed Care - HMO | Attending: Internal Medicine | Admitting: Internal Medicine

## 2015-06-19 ENCOUNTER — Emergency Department: Payer: Commercial Managed Care - HMO

## 2015-06-19 DIAGNOSIS — L89302 Pressure ulcer of unspecified buttock, stage 2: Secondary | ICD-10-CM | POA: Diagnosis present

## 2015-06-19 DIAGNOSIS — I10 Essential (primary) hypertension: Secondary | ICD-10-CM | POA: Diagnosis present

## 2015-06-19 DIAGNOSIS — G9340 Encephalopathy, unspecified: Secondary | ICD-10-CM | POA: Diagnosis present

## 2015-06-19 DIAGNOSIS — R531 Weakness: Secondary | ICD-10-CM | POA: Diagnosis present

## 2015-06-19 DIAGNOSIS — E876 Hypokalemia: Secondary | ICD-10-CM | POA: Diagnosis present

## 2015-06-19 DIAGNOSIS — R339 Retention of urine, unspecified: Secondary | ICD-10-CM | POA: Diagnosis present

## 2015-06-19 DIAGNOSIS — I959 Hypotension, unspecified: Secondary | ICD-10-CM | POA: Diagnosis present

## 2015-06-19 DIAGNOSIS — R001 Bradycardia, unspecified: Secondary | ICD-10-CM | POA: Diagnosis present

## 2015-06-19 DIAGNOSIS — R630 Anorexia: Secondary | ICD-10-CM | POA: Diagnosis present

## 2015-06-19 DIAGNOSIS — I482 Chronic atrial fibrillation: Secondary | ICD-10-CM | POA: Diagnosis present

## 2015-06-19 DIAGNOSIS — N179 Acute kidney failure, unspecified: Principal | ICD-10-CM | POA: Diagnosis present

## 2015-06-19 DIAGNOSIS — T43025A Adverse effect of tetracyclic antidepressants, initial encounter: Secondary | ICD-10-CM | POA: Diagnosis present

## 2015-06-19 DIAGNOSIS — L8990 Pressure ulcer of unspecified site, unspecified stage: Secondary | ICD-10-CM | POA: Insufficient documentation

## 2015-06-19 DIAGNOSIS — Z833 Family history of diabetes mellitus: Secondary | ICD-10-CM | POA: Diagnosis not present

## 2015-06-19 DIAGNOSIS — E860 Dehydration: Secondary | ICD-10-CM | POA: Diagnosis present

## 2015-06-19 DIAGNOSIS — N390 Urinary tract infection, site not specified: Secondary | ICD-10-CM | POA: Diagnosis present

## 2015-06-19 DIAGNOSIS — G9341 Metabolic encephalopathy: Secondary | ICD-10-CM | POA: Diagnosis present

## 2015-06-19 DIAGNOSIS — Z96649 Presence of unspecified artificial hip joint: Secondary | ICD-10-CM | POA: Diagnosis present

## 2015-06-19 DIAGNOSIS — R410 Disorientation, unspecified: Secondary | ICD-10-CM

## 2015-06-19 LAB — URINALYSIS COMPLETE WITH MICROSCOPIC (ARMC ONLY)
BACTERIA UA: NONE SEEN
Bilirubin Urine: NEGATIVE
GLUCOSE, UA: NEGATIVE mg/dL
Hgb urine dipstick: NEGATIVE
Ketones, ur: NEGATIVE mg/dL
NITRITE: NEGATIVE
PROTEIN: NEGATIVE mg/dL
RBC / HPF: NONE SEEN RBC/hpf (ref 0–5)
SPECIFIC GRAVITY, URINE: 1.016 (ref 1.005–1.030)
SQUAMOUS EPITHELIAL / LPF: NONE SEEN
pH: 5 (ref 5.0–8.0)

## 2015-06-19 LAB — COMPREHENSIVE METABOLIC PANEL
ALK PHOS: 58 U/L (ref 38–126)
ALT: 14 U/L — AB (ref 17–63)
ANION GAP: 10 (ref 5–15)
AST: 23 U/L (ref 15–41)
Albumin: 3.2 g/dL — ABNORMAL LOW (ref 3.5–5.0)
BILIRUBIN TOTAL: 1.2 mg/dL (ref 0.3–1.2)
BUN: 54 mg/dL — ABNORMAL HIGH (ref 6–20)
CALCIUM: 9.8 mg/dL (ref 8.9–10.3)
CO2: 28 mmol/L (ref 22–32)
CREATININE: 1.44 mg/dL — AB (ref 0.61–1.24)
Chloride: 104 mmol/L (ref 101–111)
GFR calc non Af Amer: 43 mL/min — ABNORMAL LOW (ref >60)
GFR, EST AFRICAN AMERICAN: 50 mL/min — AB (ref >60)
Glucose, Bld: 123 mg/dL — ABNORMAL HIGH (ref 65–99)
Potassium: 3.9 mmol/L (ref 3.5–5.1)
Sodium: 142 mmol/L (ref 135–145)
TOTAL PROTEIN: 7.8 g/dL (ref 6.5–8.1)

## 2015-06-19 LAB — CBC
HCT: 36.8 % — ABNORMAL LOW (ref 40.0–52.0)
HEMOGLOBIN: 12 g/dL — AB (ref 13.0–18.0)
MCH: 29.9 pg (ref 26.0–34.0)
MCHC: 32.7 g/dL (ref 32.0–36.0)
MCV: 91.3 fL (ref 80.0–100.0)
PLATELETS: 132 10*3/uL — AB (ref 150–440)
RBC: 4.03 MIL/uL — AB (ref 4.40–5.90)
RDW: 13.8 % (ref 11.5–14.5)
WBC: 9.8 10*3/uL (ref 3.8–10.6)

## 2015-06-19 LAB — TROPONIN I

## 2015-06-19 LAB — BRAIN NATRIURETIC PEPTIDE: B NATRIURETIC PEPTIDE 5: 83 pg/mL (ref 0.0–100.0)

## 2015-06-19 MED ORDER — ACETAMINOPHEN 325 MG PO TABS: 650.0000 mg | ORAL_TABLET | Freq: Four times a day (QID) | ORAL | Status: DC | PRN

## 2015-06-19 MED ORDER — MIRTAZAPINE 7.5 MG PO TABS: 7.5000 mg | ORAL_TABLET | Freq: Every day | ORAL | Status: DC

## 2015-06-19 MED ORDER — SODIUM CHLORIDE 0.9 % IV BOLUS (SEPSIS)
1000.0000 mL | Freq: Once | INTRAVENOUS | Status: AC
  Administered 2015-06-19: 1000 mL via INTRAVENOUS

## 2015-06-19 MED ORDER — ONDANSETRON HCL 4 MG PO TABS: 4.0000 mg | ORAL_TABLET | Freq: Four times a day (QID) | ORAL | Status: DC | PRN

## 2015-06-19 MED ORDER — AMLODIPINE BESYLATE 5 MG PO TABS: 5.0000 mg | ORAL_TABLET | Freq: Every day | ORAL | Status: DC

## 2015-06-19 MED ORDER — HYDROCODONE-ACETAMINOPHEN 5-325 MG PO TABS: 1.0000 | ORAL_TABLET | ORAL | Status: DC | PRN

## 2015-06-19 MED ORDER — DOCUSATE SODIUM 100 MG PO CAPS
100.0000 mg | ORAL_CAPSULE | Freq: Two times a day (BID) | ORAL | Status: DC
  Administered 2015-06-19 – 2015-06-25 (×10): 100 mg via ORAL
  Filled 2015-06-19 (×11): qty 1

## 2015-06-19 MED ORDER — BISACODYL 5 MG PO TBEC
5.0000 mg | DELAYED_RELEASE_TABLET | Freq: Every day | ORAL | Status: DC | PRN
Start: 2015-06-19 — End: 2015-06-26

## 2015-06-19 MED ORDER — TRAZODONE HCL 50 MG PO TABS
25.0000 mg | ORAL_TABLET | Freq: Every evening | ORAL | Status: DC | PRN
  Administered 2015-06-20: 25 mg via ORAL
  Filled 2015-06-19: qty 1

## 2015-06-19 MED ORDER — ASPIRIN EC 81 MG PO TBEC
81.0000 mg | DELAYED_RELEASE_TABLET | Freq: Every day | ORAL | Status: DC
  Administered 2015-06-19 – 2015-06-25 (×6): 81 mg via ORAL
  Filled 2015-06-19 (×7): qty 1

## 2015-06-19 MED ORDER — ONDANSETRON HCL 4 MG/2ML IJ SOLN: 4.0000 mg | Freq: Four times a day (QID) | INTRAMUSCULAR | Status: DC | PRN

## 2015-06-19 MED ORDER — ALUM & MAG HYDROXIDE-SIMETH 200-200-20 MG/5ML PO SUSP: 30.0000 mL | Freq: Four times a day (QID) | ORAL | Status: DC | PRN

## 2015-06-19 MED ORDER — ACETAMINOPHEN 650 MG RE SUPP: 650.0000 mg | Freq: Four times a day (QID) | RECTAL | Status: DC | PRN

## 2015-06-19 MED ORDER — ENOXAPARIN SODIUM 40 MG/0.4ML ~~LOC~~ SOLN
40.0000 mg | SUBCUTANEOUS | Status: DC
  Administered 2015-06-19 – 2015-06-25 (×7): 40 mg via SUBCUTANEOUS
  Filled 2015-06-19 (×7): qty 0.4

## 2015-06-19 MED ORDER — HALOPERIDOL LACTATE 5 MG/ML IJ SOLN: 5.0000 mg | Freq: Once | INTRAMUSCULAR | Status: DC | PRN

## 2015-06-19 MED ORDER — SODIUM CHLORIDE 0.9 % IV SOLN
INTRAVENOUS | Status: DC
  Administered 2015-06-19 – 2015-06-20 (×2): via INTRAVENOUS

## 2015-06-19 NOTE — Progress Notes (Signed)
Patient alert and oriented x1. Oriented to room, unit, and call bell. Admission completed. No complaints at this time. Will cont to assess. Skin assessment verified by Lester Carolinarystal G, RN. Telemetry box verified. Trudee KusterBrandi R Mansfield

## 2015-06-19 NOTE — H&P (Signed)
[facility] [place] [facility]   PATIENT NAME: Edwin Sutton    MR#:  [id number]  DATE OF BIRTH:  10/14/1928  DATE OF ADMISSION:  06/19/2015  PRIMARY CARE PHYSICIAN: Rafael BihariWALKER III, JOHN B, MD   REQUESTING/REFERRING PHYSICIAN: Carollee MassedKaminski  CHIEF COMPLAINT:   Chief Complaint  Patient presents with  . Altered Mental Status    HISTORY OF PRESENT ILLNESS:  Edwin ErikssonClayton Holtmeyer  is a 82 y.o. male with a known history of hypertension, chronic atrial fibrillation brought in by the family secondary to poor by mouth intake, confusion. According to the family patient has been now going downhill for about 2 weeks. Not eating or drinking for the past 2 weeks. And also noted to have some confusion sedation today. Patient has been started on Remeron 7.5 mg daily a week ago, according to the family since then he is not eating and drinking and getting confused. .Patient is mumbling , not able to give clear history.Marland Kitchen. And was hypotensive with  the blood pressure 63/41 when he came, also bradycardic with a heart rate as low as 40. PAST MEDICAL HISTORY:   Past Medical History  Diagnosis Date  . Hypertension     PAST SURGICAL HISTOIRY:  No past surgical history on file. Past surgical history significant for history of total hip arthroplasty on the right and left.  SOCIAL HISTORY:   Social History  Substance Use Topics  . Smoking status: Never Smoker   . Smokeless tobacco: Not on file  . Alcohol Use: No    FAMILY HISTORY:  No family history on file. Mother had a cancer, sister had diabetes,. DRUG ALLERGIES:  No Known Allergies  REVIEW OF SYSTEMS: Unable to obtain review of systems because he is confused.   CONSTITUTIONAL:   MEDICATIONS AT HOME:   Prior to Admission medications   Medication Sig Start Date End Date Taking? Authorizing Provider  acetaminophen (TYLENOL) 500 MG tablet Take 500-1,000 mg by mouth every 6 (six) hours as needed for mild pain or headache.    Yes  Historical Provider, MD  amLODipine (NORVASC) 5 MG tablet Take 5 mg by mouth daily.   Yes Historical Provider, MD  aspirin EC 81 MG tablet Take 81 mg by mouth daily.   Yes Historical Provider, MD  benazepril (LOTENSIN) 40 MG tablet Take 40 mg by mouth daily.   Yes Historical Provider, MD  hydrochlorothiazide (HYDRODIURIL) 25 MG tablet Take 25 mg by mouth daily.   Yes Historical Provider, MD  mirtazapine (REMERON) 7.5 MG tablet Take 7.5 mg by mouth at bedtime.   Yes Historical Provider, MD  potassium chloride (K-DUR,KLOR-CON) 10 MEQ tablet Take 10 mEq by mouth daily.   Yes Historical Provider, MD  cephALEXin (KEFLEX) 500 MG capsule Take 1 capsule (500 mg total) by mouth 4 (four) times daily. Patient not taking: Reported on 06/19/2015 05/13/15   Irean HongJade J Sung, MD      VITAL SIGNS:  Blood pressure 94/76, pulse 51, temperature 98 F (36.7 C), resp. rate 14, height 6' (1.829 m), weight 81.194 kg (179 lb), SpO2 99 %.  PHYSICAL EXAMINATION:  GENERAL:  82 y.o.-year-old patient lying in the bed with no acute distress.  EYES: Pupils equal, round, reactive to light and accommodation. No scleral icterus. Extraocular muscles intact.  HEENT: Head atraumatic, normocephalic. Oropharynx and nasopharynx clear.  NECK:  Supple, no jugular venous distention. No thyroid enlargement, no tenderness.  LUNGS: Normal breath sounds bilaterally, no wheezing, rales,rhonchi or crepitation. No use of  accessory muscles of respiration.  CARDIOVASCULAR: S1, S2 normal. No murmurs, rubs, or gallops.  ABDOMEN: Soft, nontender, nondistended. Bowel sounds present. No organomegaly or mass.  EXTREMITIES: No pedal edema, cyanosis, or clubbing.  NEUROLOGIC:Unable to  do full neurological exam secondary to confusion. \PSYCHIATRIC;:Awake but confused.  SKIN: No obvious rash, lesion, or ulcer.   LABORATORY PANEL:   CBC  Recent Labs Lab 06/19/15 1504  WBC 9.8  HGB 12.0*  HCT 36.8*  PLT 132*    ------------------------------------------------------------------------------------------------------------------  Chemistries   Recent Labs Lab 06/19/15 1504  NA 142  K 3.9  CL 104  CO2 28  GLUCOSE 123*  BUN 54*  CREATININE 1.44*  CALCIUM 9.8  AST 23  ALT 14*  ALKPHOS 58  BILITOT 1.2   ------------------------------------------------------------------------------------------------------------------  Cardiac Enzymes  Recent Labs Lab 06/19/15 1504  TROPONINI <0.03   ------------------------------------------------------------------------------------------------------------------  RADIOLOGY:  Dg Chest 1 View  06/19/2015  CLINICAL DATA:  Decreasing appetite.  Confusion. EXAM: CHEST 1 VIEW COMPARISON:  May 13, 2015 FINDINGS: No pneumothorax. The cardiomediastinal silhouette is unchanged. No pulmonary nodules or masses. No acute infiltrates are identified. IMPRESSION: No active disease. Electronically Signed   By: Gerome Sam III M.D   On: 06/19/2015 16:06   Ct Head Wo Contrast  06/19/2015  CLINICAL DATA:  Increasing confusion. EXAM: CT HEAD WITHOUT CONTRAST TECHNIQUE: Contiguous axial images were obtained from the base of the skull through the vertex without intravenous contrast. COMPARISON:  May 15, 2015 FINDINGS: Paranasal sinuses, mastoid air cells, and bones are within normal limits. The extracranial soft tissues are normal. No subdural, epidural, or subarachnoid hemorrhage. No mass, mass effect, or midline shift. Ventricles and sulci are unchanged. The cerebellum, brainstem, and basal cisterns are normal. No acute cortical ischemia or infarct. A lacunar infarct in the left corona radiata anteriorly on series 2, image 19 is again identified. White matter changes are seen in the anterior limb of the left internal capsule, unchanged. No changes within the white matter are identified. IMPRESSION: No acute intracranial process identified to explain the patient's  symptoms. Electronically Signed   By: Gerome Sam III M.D   On: 06/19/2015 16:03    EKG:   Orders placed or performed during the hospital encounter of 06/19/15  . ED EKG  . ED EKG  ATRIAL FIBRILLATION WITH 48 BPM, IMPRESSION AND PLAN:   #1 hypotension and bradycardia likely medication induced: He was on metoprolol at home, HCTZ ;going to hold both of them. And monitored on telemetry. Continue IV hydration, monitor for signs of sepsis. #2 acute renal failure due to dehydration and poor by mouth intake: Continue IV hydration, avoid nephrotoxic agents. #3 altered mental status, confusion episodes, likely secondary to Remeron use, patient recently was started on Remeron, since then he is behaving like this as per family. So hold the Remeron at this time. #4 generalized weakness: Physical therapy evaluation. #5 anorexia: Patient will be seen by dietitian, continue ensure, regular diet at this time,  All the records are reviewed and case discussed with ED provider. Management plans discussed with the patient, family and they are in agreement.  CODE STATUS:full  TOTAL TIME TAKING CARE OF THIS PATIENT: 55 minutes.    Katha Hamming M.D on 06/19/2015 at 5:13 PM  Between 7am to 6pm - Pager - [phone]  After 6pm go to [email or web address] - password [email or web address]  [facility]  Office  [phone]  CC: Primary care physician; Rafael Bihari, MD  Note: This dictation was prepared  with Dragon dictation along with smaller phrase technology. Any transcriptional errors that result from this process are unintentional.

## 2015-06-19 NOTE — ED Provider Notes (Signed)
[facility] [place] Emergency Department Provider Note  ____________________________________________  Time seen: 1505  I have reviewed the triage vital signs and the nursing notes.  History by:  Primarily from family, with limited input from the patient  HISTORY  Chief Complaint Altered Mental Status  general weakness  decreased intake    HPI Edwin Sutton is a 82 y.o. male who has been brought to the emergency department by his family because a slowly increasing weakness and decreased intake by mouth. They report he has appeared more weak. He has been drinking 2-3 ensures a day, but has not had any other intake. They report that he sometimes appears confused. This includes some possible hallucinatory moments. They deny any noted fever or rigors. The patient does report he feels some chills. He denies any focal complaints, including the denial of any abdominal pain or chest pain.  The family points out that he has a mild increase in his peripheral edema in both lower legs.  The patient does have a history of atrial fibrillation. He has not on any anticoagulants.    Past Medical History  Diagnosis Date  . Hypertension    atrial fibrillation Prior CVA  There are no active problems to display for this patient.   No past surgical history on file.  Current Outpatient Rx  Name  Route  Sig  Dispense  Refill  . acetaminophen (TYLENOL) 500 MG tablet   Oral   Take 500 mg by mouth every 6 (six) hours as needed.         Marland Kitchen amLODipine (NORVASC) 5 MG tablet   Oral   Take 5 mg by mouth daily.         Marland Kitchen aspirin EC 81 MG tablet   Oral   Take 81 mg by mouth daily.         . benazepril (LOTENSIN) 40 MG tablet   Oral   Take 40 mg by mouth daily.         . cephALEXin (KEFLEX) 500 MG capsule   Oral   Take 1 capsule (500 mg total) by mouth 4 (four) times daily.   28 capsule   0   . hydrochlorothiazide (HYDRODIURIL) 25 MG tablet   Oral   Take 25 mg by  mouth daily.         . metoprolol tartrate (LOPRESSOR) 25 MG tablet   Oral   Take 25 mg by mouth 2 (two) times daily.         . potassium chloride (K-DUR) 10 MEQ tablet   Oral   Take 10 mEq by mouth daily.           Allergies Review of patient's allergies indicates no known allergies.  No family history on file.  Social History Social History  Substance Use Topics  . Smoking status: Never Smoker   . Smokeless tobacco: None  . Alcohol Use: No    Review of Systems Limited review of systems as the patient provides little verbal communication Constitutional: Positive for chills. ENT: Negative for congestion. Cardiovascular: Negative for chest pain. Respiratory: Negative for cough. Gastrointestinal: Patient denies abdominal pain. No history of nausea vomiting or diarrhea recently. Skin: Negative for rash. Neurological: Increased weakness. Questions of hallucinatory episodes per family   10-point ROS otherwise negative.  ____________________________________________   PHYSICAL EXAM:  VITAL SIGNS: ED Triage Vitals  Enc Vitals Group     BP 06/19/15 1446 63/41 mmHg     Pulse Rate 06/19/15 1446 48  Resp 06/19/15 1446 18     Temp 06/19/15 1446 98 F (36.7 C)     Temp src --      SpO2 06/19/15 1446 100 %     Weight 06/19/15 1446 190 lb (86.183 kg)     Height 06/19/15 1446 6' (1.829 m)     Head Cir --      Peak Flow --      Pain Score --      Pain Loc --      Pain Edu? --      Excl. in GC? --     Constitutional: 82 year old male who appears generally weak with eyes half open. He does respond to voice and answers questions, but with a very soft voice with words that are not easily understood ENT   Head: Normocephalic and atraumatic.   Nose: No congestion/rhinnorhea.       Mouth: No erythema, no swelling   Cardiovascular: Bradycardia at 54, irregular rhythm, no murmur noted Respiratory:  Normal respiratory effort, no tachypnea.    Breath sounds are  clear and equal bilaterally.  Gastrointestinal: No apparent Nontender Back: No muscle spasm, no tenderness, no CVA tenderness. Musculoskeletal: No deformity noted. Nontender with normal range of motion in all extremities.  1-2+ edema bilaterally  Neurologic:  Appears generally weak. Responds to voice and other stimuli, but has a very soft voice that is poorly understood. Able to move all 4 extremities to some degree. Limited neurologic exam otherwise with no focal deficit noted. Skin:  Skin is warm, dry. No rash noted. Psychiatric: Appears weak, difficult understand, question of orientation. ____________________________________________    LABS (pertinent positives/negatives)  Labs Reviewed  COMPREHENSIVE METABOLIC PANEL - Abnormal; Notable for the following:    Glucose, Bld 123 (*)    BUN 54 (*)    Creatinine, Ser 1.44 (*)    Albumin 3.2 (*)    ALT 14 (*)    GFR calc non Af Amer 43 (*)    GFR calc Af Amer 50 (*)    All other components within normal limits  CBC - Abnormal; Notable for the following:    RBC 4.03 (*)    Hemoglobin 12.0 (*)    HCT 36.8 (*)    Platelets 132 (*)    All other components within normal limits  CULTURE, BLOOD (ROUTINE X 2)  CULTURE, BLOOD (ROUTINE X 2)  TROPONIN I  BRAIN NATRIURETIC PEPTIDE  URINALYSIS COMPLETEWITH MICROSCOPIC ([facility] ONLY)     ____________________________________________   EKG  ED ECG REPORT I, Bralee Feldt W, the attending physician, personally viewed and interpreted this ECG.   Date: 06/19/2015  EKG Time: 1500  Rate: 48  Rhythm: Bradycardic with Atrial fibrillation    Axis: Normal  Intervals: Normal  ST&T Change: None   ____________________________________________    RADIOLOGY  Chest x-ray:  IMPRESSION: No active disease.  CT head: IMPRESSION: No acute intracranial process identified to explain the patient's symptoms. ____________________________________________   PROCEDURES  CRITICAL CARE Performed  by: Darien Ramus   Total critical care time: 40 minutes  Critical care time was exclusive of separately billable procedures and treating other patients.  Critical care was necessary to treat or prevent imminent or life-threatening deterioration.  Critical care was time spent personally by me on the following activities: development of treatment plan with patient and/or surrogate as well as nursing, discussions with consultants, evaluation of patient's response to treatment, examination of patient, obtaining history from patient or surrogate, ordering and performing treatments and  interventions, ordering and review of laboratory studies, ordering and review of radiographic studies, pulse oximetry and re-evaluation of patient's condition.   ____________________________________________   INITIAL IMPRESSION / ASSESSMENT AND PLAN / ED COURSE  Pertinent labs & imaging results that were available during my care of the patient were reviewed by me and considered in my medical decision making (see chart for details).  82 year old male arrives feeling weak, with decreased mental status, possible hallucinations. Unable to care for self. His blood pressure on arrival was critically low at 63/41.  I suspect the patient has had poor intake leading to likely dehydration and possible acute renal failure.  We began resuscitation with 1 L of normal saline.  Labs and further evaluation pending.  ----------------------------------------- 4:24 PM on 06/19/2015 -----------------------------------------  Patient has a BUN of 54 and creatinine of 1.44. We have started a second liter of normal saline at a slower rate. His blood count is reasonable. His chest x-ray is normal. Head CT does not show any acute changes. Blood cultures and urinalysis are pending. I discussed admission with the hospitalist team.  ____________________________________________   FINAL CLINICAL IMPRESSION(S) / ED DIAGNOSES  Final  diagnoses:  Weakness  Hypotension, unspecified  Dehydration      Darien Ramusavid W Jakaiden Fill, MD 06/19/15 [id number]

## 2015-06-19 NOTE — ED Notes (Signed)
Departure conditions & care handoff entered in error.

## 2015-06-19 NOTE — ED Notes (Signed)
Per son who lives with patient patient has had decreased appetite, strength and increased confusion x 3 weeks. States has been taking 3 ensures a day but little else, voids approx 3 times a day. Does take some juice. Patient denies pain.

## 2015-06-20 DIAGNOSIS — L8990 Pressure ulcer of unspecified site, unspecified stage: Secondary | ICD-10-CM | POA: Insufficient documentation

## 2015-06-20 LAB — CBC
HCT: 34.8 % — ABNORMAL LOW (ref 40.0–52.0)
Hemoglobin: 11.6 g/dL — ABNORMAL LOW (ref 13.0–18.0)
MCH: 30.2 pg (ref 26.0–34.0)
MCHC: 33.4 g/dL (ref 32.0–36.0)
MCV: 90.3 fL (ref 80.0–100.0)
PLATELETS: 106 10*3/uL — AB (ref 150–440)
RBC: 3.86 MIL/uL — AB (ref 4.40–5.90)
RDW: 14.4 % (ref 11.5–14.5)
WBC: 9.2 10*3/uL (ref 3.8–10.6)

## 2015-06-20 LAB — GLUCOSE, CAPILLARY: GLUCOSE-CAPILLARY: 79 mg/dL (ref 65–99)

## 2015-06-20 LAB — BASIC METABOLIC PANEL
Anion gap: 8 (ref 5–15)
BUN: 38 mg/dL — ABNORMAL HIGH (ref 6–20)
CALCIUM: 8.7 mg/dL — AB (ref 8.9–10.3)
CO2: 24 mmol/L (ref 22–32)
CREATININE: 0.96 mg/dL (ref 0.61–1.24)
Chloride: 108 mmol/L (ref 101–111)
GFR calc non Af Amer: >60 mL/min (ref >60)
Glucose, Bld: 85 mg/dL (ref 65–99)
Potassium: 3.5 mmol/L (ref 3.5–5.1)
SODIUM: 140 mmol/L (ref 135–145)

## 2015-06-20 MED ORDER — CEFTRIAXONE SODIUM 1 G IJ SOLR
1.0000 g | INTRAMUSCULAR | Status: DC
  Administered 2015-06-20: 1 g via INTRAVENOUS
  Filled 2015-06-20: qty 10

## 2015-06-20 MED ORDER — DEXTROSE 5 % IV SOLN
1.0000 g | INTRAVENOUS | Status: DC
  Administered 2015-06-21 – 2015-06-25 (×5): 1 g via INTRAVENOUS
  Filled 2015-06-20 (×6): qty 10

## 2015-06-20 NOTE — Progress Notes (Signed)
Pt reports needing to use the urinal but unable to urinate. Bladder scanned. 842 noted in bladder. In and Out catherization performed. Pt tolerated procedure. Urine drained slowly. Urine culture collected. Will continue to assess.

## 2015-06-20 NOTE — Care Management Note (Signed)
Case Management Note  Patient Details  Name: Edwin Sutton MRN: [id number] Date of Birth: 10/06/1928  Subjective/Objective:   RNCM assessment for discharge planning. Spoke with daughter, Edwin Sutton by phone. She states patient lives at home with his 2 sons, [name] and Alinda Money.  [name] states they work together but she could be contacted with any needs for patient.  He has a walker but doesn't use it. PT recommending 3 -in- 1. Ordered from [facility] at [facility]. Discussed home health services. Offered choices and daughter preferred [facility]. Spoke with Dr. Imogene Burn and recommended SN, PT, HHA and SW. [facility] order placed with Barbara Cower at [facility]. It is anticipated that patient will be ready for discharge in the next 1-2 days. The daughter denies issues with patient obtaining medications. copays or transportation. PCP is Dr. Yates Decamp.               Action/Plan:   Expected Discharge Date:                  Expected Discharge Plan:  Home w Home Health Services  In-House Referral:     Discharge planning Services  CM Consult  Post Acute Care Choice:  Home Health Choice offered to:  Adult Children  DME Arranged:  3 in 1 DME Agency:  [facility]  HH Arranged:  RN, PT, Nurse's Aide, Social Work [employer] Agency:  [facility]  Status of Service:  In process, will continue to follow  Medicare Important Message Given:    Date Medicare IM Given:    Medicare IM give by:    Date Additional Medicare IM Given:    Additional Medicare Important Message give by:     If discussed at Long Length of Stay Meetings, dates discussed:    Additional Comments:  Marily Memos, RN 06/20/2015, 2:34 PM

## 2015-06-20 NOTE — Progress Notes (Signed)
[facility] Phys[facility] - [place] at [facility]   PATIENT NAME: Edwin Sutton    MR#:  [id number]  DATE OF BIRTH:  [birth year]  SUBJECTIVE:  CHIEF COMPLAINT:   Chief Complaint  Patient presents with  . Altered Mental Status  the patient is confused.   REVIEW OF SYSTEMS:  Unable to get ROS.  DRUG ALLERGIES:  No Known Allergies  VITALS:  Blood pressure 116/66, pulse 88, temperature 97.9 F (36.6 C), temperature source Oral, resp. rate 17, height 6' (1.829 m), weight 75.705 kg (166 lb 14.4 oz), SpO2 98 %.  PHYSICAL EXAMINATION:  GENERAL:  82 y.o.-year-old patient lying in the bed with no acute distress.  EYES: Pupils equal, round, reactive to light and accommodation. No scleral icterus. Extraocular muscles intact.  HEENT: Head atraumatic, normocephalic. Oropharynx and nasopharynx clear.  NECK:  Supple, no jugular venous distention. No thyroid enlargement, no tenderness.  LUNGS: Normal breath sounds bilaterally, no wheezing, rales,rhonchi or crepitation. No use of accessory muscles of respiration.  CARDIOVASCULAR: S1, S2 normal. No murmurs, rubs, or gallops.  ABDOMEN: Soft, nontender, nondistended. Bowel sounds present. No organomegaly or mass.  EXTREMITIES: No pedal edema, cyanosis, or clubbing.  NEUROLOGIC: only follow limited commands. Muscle strength 3/5 in all extremities. Sensation intact. Gait not checked.  PSYCHIATRIC: The patient is confused.  SKIN: No obvious rash, lesion, or ulcer.    LABORATORY PANEL:   CBC  Recent Labs Lab 06/20/15 0453  WBC 9.2  HGB 11.6*  HCT 34.8*  PLT 106*   ------------------------------------------------------------------------------------------------------------------  Chemistries   Recent Labs Lab 06/19/15 1504 06/20/15 0453  NA 142 140  K 3.9 3.5  CL 104 108  CO2 28 24  GLUCOSE 123* 85  BUN 54* 38*  CREATININE 1.44* 0.96  CALCIUM 9.8 8.7*  AST 23  --   ALT 14*  --   ALKPHOS 58  --   BILITOT 1.2  --     ------------------------------------------------------------------------------------------------------------------  Cardiac Enzymes  Recent Labs Lab 06/19/15 1504  TROPONINI <0.03   ------------------------------------------------------------------------------------------------------------------  RADIOLOGY:  Dg Chest 1 View  06/19/2015  CLINICAL DATA:  Decreasing appetite.  Confusion. EXAM: CHEST 1 VIEW COMPARISON:  May 13, 2015 FINDINGS: No pneumothorax. The cardiomediastinal silhouette is unchanged. No pulmonary nodules or masses. No acute infiltrates are identified. IMPRESSION: No active disease. Electronically Signed   By: Gerome Sam III M.D   On: 06/19/2015 16:06   Ct Head Wo Contrast  06/19/2015  CLINICAL DATA:  Increasing confusion. EXAM: CT HEAD WITHOUT CONTRAST TECHNIQUE: Contiguous axial images were obtained from the base of the skull through the vertex without intravenous contrast. COMPARISON:  May 15, 2015 FINDINGS: Paranasal sinuses, mastoid air cells, and bones are within normal limits. The extracranial soft tissues are normal. No subdural, epidural, or subarachnoid hemorrhage. No mass, mass effect, or midline shift. Ventricles and sulci are unchanged. The cerebellum, brainstem, and basal cisterns are normal. No acute cortical ischemia or infarct. A lacunar infarct in the left corona radiata anteriorly on series 2, image 19 is again identified. White matter changes are seen in the anterior limb of the left internal capsule, unchanged. No changes within the white matter are identified. IMPRESSION: No acute intracranial process identified to explain the patient's symptoms. Electronically Signed   By: Gerome Sam III M.D   On: 06/19/2015 16:03    EKG:   Orders placed or performed during the hospital encounter of 06/19/15  . ED EKG  . ED EKG    ASSESSMENT AND PLAN:   #  1 Hypotension and bradycardia likely medication induced:  Hold metoprolol, benazepril  and HCTZ. Continue IV hydration.  #2 Acute renal failure due to dehydration and poor by mouth intake: Continue IV hydration, avoid nephrotoxic agents. F/u BMP.  #3 altered mental status, acute metabolic encephalopathy. likely secondary to Remeron use, patient recently was started on Remeron, since then he is behaving like this as per family. So hold the Remeron at this time.  #4 generalized weakness: Per PT, need HHPT.  #5 anorexia: continue ensure, regular diet at this time,  * Possible UTI per UA. F/u U/C, started rocephin this am.   All the records are reviewed and case discussed with Care Management/Social Workerr. Management plans discussed with the patient, his daughter and 2 sons and they are in agreement.  CODE STATUS: full code.  TOTAL TIME TAKING CARE OF THIS PATIENT: 37 minutes.  Greater than 50% time was spent on coordination of care and face-to-face counseling.  POSSIBLE D/C IN 2 DAYS, DEPENDING ON CLINICAL CONDITION.   Shaune Pollack M.D on 06/20/2015 at 5:32 PM  Between 7am to 6pm - Pager - [phone]  After 6pm go to [email or web address] - password [email or web address] [facility]  [place] [facility]  Office  [phone]  CC: Primary care physician; Rafael Bihari, MD

## 2015-06-20 NOTE — Evaluation (Signed)
Physical Therapy Evaluation Patient Details Name: Edwin Sutton MRN: [id number] DOB: [birth year] Today's Date: 06/20/2015   History of Present Illness  Pt admitted for ARF. Pt with history of HTN. Per chart review, pt with increased confusion and activity the past 2 weeks. Appears to be close to baseline at time of evaluation.  Clinical Impression  Pt is a pleasant 82 year old male who was admitted for acute renal failure. Pt performs transfers with min assist and ambulation with cga using rw and then further ambulation without AD. Pt demonstrates deficits with balance/mobility/endurance. Would recommend use of rw while in hospital to prevent falls and improve balance. PT with safe technique using rw. Would benefit from skilled PT to address above deficits and promote optimal return to PLOF      Follow Up Recommendations Home health PT    Equipment Recommendations  3in1 (PT) (shower chair)    Recommendations for Other Services       Precautions / Restrictions Precautions Precautions: Fall Restrictions Weight Bearing Restrictions: No      Mobility  Bed Mobility               General bed mobility comments: not performed as pt received EOB.  Transfers Overall transfer level: Needs assistance Equipment used: Rolling walker (2 wheeled) Transfers: Sit to/from Stand Sit to Stand: Min assist         General transfer comment: safe technique with cues for momentum on trying to stand up. Once standing, pt able to maintain static balance. Safe technique performed  Ambulation/Gait Ambulation/Gait assistance: Min guard Ambulation Distance (Feet): 100 Feet Assistive device: Rolling walker (2 wheeled) Gait Pattern/deviations: Step-to pattern     General Gait Details: ambulated using rw with reciprocal gait pattern. Safe technique performed with no LOB noted. Pt wishes to return to room to use bathroom.  Stairs            Wheelchair Mobility    Modified Rankin  (Stroke Patients Only)       Balance Overall balance assessment: Needs assistance Sitting-balance support: Feet supported Sitting balance-Leahy Scale: Normal     Standing balance support: Bilateral upper extremity supported Standing balance-Leahy Scale: Good                               Pertinent Vitals/Pain Pain Assessment: No/denies pain    Home Living Family/patient expects to be discharged to:: Private residence Living Arrangements: Children Available Help at Discharge: Family (one son available 24/7) Type of Home: House Home Access: Stairs to enter Entrance Stairs-Rails: Left Entrance Stairs-Number of Steps: 3 Home Layout: One level Home Equipment: Walker - 2 wheels;Cane - single point      Prior Function Level of Independence: Independent         Comments: household ambulator     Higher education careers adviser        Extremity/Trunk Assessment   Upper Extremity Assessment: Overall WFL for tasks assessed           Lower Extremity Assessment: Generalized weakness (grossly 4/5)         Communication   Communication: No difficulties  Cognition Arousal/Alertness: Awake/alert Behavior During Therapy: WFL for tasks assessed/performed Overall Cognitive Status: Within Functional Limits for tasks assessed                      General Comments      Exercises Other Exercises Other Exercises: set up and supervision  given for bathroom tasks. Pt able to take diaper off and perform hygiene with independence Other Exercises: Pt continued ambulation in hallway without rw for additional 60'. Pt occasionally reached for IV pole, however no LOB noted. Pt with small BOS and short step length. Pt educated for use of rw while in hospital.      Assessment/Plan    PT Assessment Patient needs continued PT services  PT Diagnosis Difficulty walking;Generalized weakness   PT Problem List Decreased strength;Decreased activity tolerance;Decreased mobility   PT Treatment Interventions Gait training;Therapeutic exercise;Balance training   PT Goals (Current goals can be found in the Care Plan section) Acute Rehab PT Goals Patient Stated Goal: to go home PT Goal Formulation: With patient Time For Goal Achievement: 07/04/15 Potential to Achieve Goals: Good    Frequency Min 2X/week   Barriers to discharge        Co-evaluation               End of Session Equipment Utilized During Treatment: Gait belt Activity Tolerance: Patient tolerated treatment well Patient left: in bed;with family/visitor present;with bed alarm set Nurse Communication: Mobility status         Time: 1037-1100 PT Time Calculation (min) (ACUTE ONLY): 23 min   Charges:   PT Evaluation $PT Eval Low Complexity: 1 Procedure PT Treatments $Gait Training: 8-22 mins   PT G Codes:        Yaret Hush 07/08/2015, 12:00 PM  Elizabeth Palau, PT, DPT [phone]

## 2015-06-21 LAB — BASIC METABOLIC PANEL
Anion gap: 6 (ref 5–15)
BUN: 15 mg/dL (ref 6–20)
CALCIUM: 8.4 mg/dL — AB (ref 8.9–10.3)
CHLORIDE: 109 mmol/L (ref 101–111)
CO2: 25 mmol/L (ref 22–32)
CREATININE: 0.66 mg/dL (ref 0.61–1.24)
GFR calc Af Amer: >60 mL/min (ref >60)
GFR calc non Af Amer: >60 mL/min (ref >60)
Glucose, Bld: 79 mg/dL (ref 65–99)
Potassium: 2.9 mmol/L — CL (ref 3.5–5.1)
Sodium: 140 mmol/L (ref 135–145)

## 2015-06-21 LAB — URINE CULTURE: CULTURE: NO GROWTH

## 2015-06-21 LAB — GLUCOSE, CAPILLARY
GLUCOSE-CAPILLARY: 78 mg/dL (ref 65–99)
Glucose-Capillary: 77 mg/dL (ref 65–99)

## 2015-06-21 LAB — MAGNESIUM: Magnesium: 1.5 mg/dL — ABNORMAL LOW (ref 1.7–2.4)

## 2015-06-21 MED ORDER — POTASSIUM CHLORIDE CRYS ER 20 MEQ PO TBCR
40.0000 meq | EXTENDED_RELEASE_TABLET | Freq: Two times a day (BID) | ORAL | Status: AC
  Administered 2015-06-21 (×2): 40 meq via ORAL
  Filled 2015-06-21 (×2): qty 2

## 2015-06-21 MED ORDER — TAMSULOSIN HCL 0.4 MG PO CAPS
0.4000 mg | ORAL_CAPSULE | Freq: Every day | ORAL | Status: DC
  Administered 2015-06-21 – 2015-06-25 (×4): 0.4 mg via ORAL
  Filled 2015-06-21 (×4): qty 1

## 2015-06-21 MED ORDER — MAGNESIUM SULFATE 2 GM/50ML IV SOLN
2.0000 g | Freq: Once | INTRAVENOUS | Status: AC
  Administered 2015-06-21: 2 g via INTRAVENOUS
  Filled 2015-06-21: qty 50

## 2015-06-21 NOTE — Care Management Important Message (Signed)
Important Message  Patient Details  Name: Edwin Sutton MRN: [id number] Date of Birth: 1929/05/29   Medicare Important Message Given:  Yes    [name] 06/21/2015, 11:26 AM

## 2015-06-21 NOTE — Progress Notes (Signed)
Pt having urinary retention. Attempted to urinate multiple times throughout the night. Output has not been more than 100 each time. Bladder scanned this Am. Results over 615. In and Out cath for 600. Md notified. Acknowledged. New orders placed.

## 2015-06-21 NOTE — Progress Notes (Signed)
[facility] Physicians - [place] at [facility]   PATIENT NAME: Edwin Sutton    MR#:  [id number]  DATE OF BIRTH:  1928/12/28  SUBJECTIVE:  CHIEF COMPLAINT:   Chief Complaint  Patient presents with  . Altered Mental Status  the patient is confused but better, AAO times 2.  Has poor oral intake.  REVIEW OF SYSTEMS:  Unable to get ROS.  DRUG ALLERGIES:  No Known Allergies  VITALS:  Blood pressure 111/65, pulse 86, temperature 98.7 F (37.1 C), temperature source Oral, resp. rate 17, height 6' (1.829 m), weight 75.252 kg (165 lb 14.4 oz), SpO2 100 %.  PHYSICAL EXAMINATION:  GENERAL:  82 y.o.-year-old patient lying in the bed with no acute distress.  EYES: Pupils equal, round, reactive to light and accommodation. No scleral icterus. Extraocular muscles intact.  HEENT: Head atraumatic, normocephalic. Oropharynx and nasopharynx clear.  NECK:  Supple, no jugular venous distention. No thyroid enlargement, no tenderness.  LUNGS: Normal breath sounds bilaterally, no wheezing, rales,rhonchi or crepitation. No use of accessory muscles of respiration.  CARDIOVASCULAR: S1, S2 normal. No murmurs, rubs, or gallops.  ABDOMEN: Soft, nontender, nondistended. Bowel sounds present. No organomegaly or mass.  EXTREMITIES: No pedal edema, cyanosis, or clubbing.  NEUROLOGIC: only follow limited commands. Muscle strength 3/5 in all extremities. Sensation intact. Gait not checked.  PSYCHIATRIC: The patient is confused. AAO times 2. SKIN: No obvious rash, lesion, or ulcer.    LABORATORY PANEL:   CBC  Recent Labs Lab 06/20/15 0453  WBC 9.2  HGB 11.6*  HCT 34.8*  PLT 106*   ------------------------------------------------------------------------------------------------------------------  Chemistries   Recent Labs Lab 06/19/15 1504  06/21/15 0740  NA 142  < > 140  K 3.9  < > 2.9*  CL 104  < > 109  CO2 28  < > 25  GLUCOSE 123*  < > 79  BUN 54*  < > 15  CREATININE 1.44*  <  > 0.66  CALCIUM 9.8  < > 8.4*  MG  --   --  1.5*  AST 23  --   --   ALT 14*  --   --   ALKPHOS 58  --   --   BILITOT 1.2  --   --   < > = values in this interval not displayed. ------------------------------------------------------------------------------------------------------------------  Cardiac Enzymes  Recent Labs Lab 06/19/15 1504  TROPONINI <0.03   ------------------------------------------------------------------------------------------------------------------  RADIOLOGY:  No results found.  EKG:   Orders placed or performed during the hospital encounter of 06/19/15  . ED EKG  . ED EKG    ASSESSMENT AND PLAN:   #1 Hypotension and bradycardia likely medication induced:  Hold metoprolol, benazepril and HCTZ. Improved with IV hydration.  #2 Acute renal failure due to dehydration and poor by mouth intake: Improved with IV hydration, avoid nephrotoxic agents.  #3 altered mental status, acute metabolic encephalopathy. likely secondary to Remeron use, patient recently was started on Remeron, since then he is behaving like this as per family. So hold the Remeron at this time. His mental status is improving.  #4 generalized weakness: Per PT, need HHPT.  #5 anorexia: continue ensure, regular diet,  * Possible UTI per UA. F/u U/C, on rocephin. *Hypokalemia, potassium is 2.9 today, give potassium supplement and follow-up BMP *Hypomagnesemia, give IV magnesium supplement and follow-up level.  All the records are reviewed and case discussed with Care Management/Social Workerr. Management plans discussed with the patient, his 2 sons and they are in agreement.  CODE STATUS: full code.  TOTAL TIME TAKING CARE OF THIS PATIENT: 37 minutes.  Greater than 50% time was spent on coordination of care and face-to-face counseling.  POSSIBLE D/C IN 2 DAYS, DEPENDING ON CLINICAL CONDITION.   Shaune Pollack M.D on 06/21/2015 at 4:10 PM  Between 7am to 6pm - Pager -  [phone]  After 6pm go to [email or web address] - password [email or web address] [facility]  [place] [facility]  Office  [phone]  CC: Primary care physician; Rafael Bihari, MD

## 2015-06-22 ENCOUNTER — Telehealth: Payer: Self-pay

## 2015-06-22 DIAGNOSIS — N179 Acute kidney failure, unspecified: Secondary | ICD-10-CM

## 2015-06-22 LAB — BASIC METABOLIC PANEL
ANION GAP: 8 (ref 5–15)
BUN: 11 mg/dL (ref 6–20)
CO2: 25 mmol/L (ref 22–32)
Calcium: 8.7 mg/dL — ABNORMAL LOW (ref 8.9–10.3)
Chloride: 105 mmol/L (ref 101–111)
Creatinine, Ser: 0.59 mg/dL — ABNORMAL LOW (ref 0.61–1.24)
GFR calc non Af Amer: >60 mL/min (ref >60)
Glucose, Bld: 96 mg/dL (ref 65–99)
Potassium: 3.2 mmol/L — ABNORMAL LOW (ref 3.5–5.1)
SODIUM: 138 mmol/L (ref 135–145)

## 2015-06-22 LAB — GLUCOSE, CAPILLARY: Glucose-Capillary: 103 mg/dL — ABNORMAL HIGH (ref 65–99)

## 2015-06-22 LAB — MAGNESIUM: Magnesium: 1.6 mg/dL — ABNORMAL LOW (ref 1.7–2.4)

## 2015-06-22 MED ORDER — POTASSIUM CHLORIDE CRYS ER 20 MEQ PO TBCR
40.0000 meq | EXTENDED_RELEASE_TABLET | Freq: Once | ORAL | Status: DC
  Filled 2015-06-22: qty 2

## 2015-06-22 MED ORDER — MAGNESIUM SULFATE 2 GM/50ML IV SOLN
2.0000 g | Freq: Once | INTRAVENOUS | Status: AC
  Administered 2015-06-22: 2 g via INTRAVENOUS
  Filled 2015-06-22: qty 50

## 2015-06-22 NOTE — Progress Notes (Signed)
Foley catheter placed. Pt tolerated procedure. Greater than 600 ml drained into catheter bag. Will continue to assess.

## 2015-06-22 NOTE — Telephone Encounter (Signed)
-----   Message from Hildred Laser, MD sent at 06/22/2015  2:35 PM EST ----- Patient will needed to follow-up for a trial of void after discharge from the hospital. Patient should see Carollee Herter or Lillia Abed. Thanks

## 2015-06-22 NOTE — Progress Notes (Signed)
[facility] Physicians - [place] at [facility]   PATIENT NAME: Edwin Sutton    MR#:  [id number]  DATE OF BIRTH:  [birth year]  SUBJECTIVE:  CHIEF COMPLAINT:   Chief Complaint  Patient presents with  . Altered Mental Status  the patient is confused and agitated.   REVIEW OF SYSTEMS:  Unable to get ROS.  DRUG ALLERGIES:  No Known Allergies  VITALS:  Blood pressure 102/64, pulse 86, temperature 98.2 F (36.8 C), temperature source Oral, resp. rate 16, height 6' (1.829 m), weight 75.297 kg (166 lb), SpO2 96 %.  PHYSICAL EXAMINATION:  GENERAL:  82 y.o.-year-old patient lying in the bed with no acute distress.  EYES: No scleral icterus. Extraocular muscles intact.  HEENT: Head atraumatic, normocephalic.  NECK:  Supple, no jugular venous distention. No thyroid enlargement, no tenderness.  LUNGS: Normal breath sounds bilaterally, no wheezing, rales,rhonchi or crepitation. No use of accessory muscles of respiration.  CARDIOVASCULAR: S1, S2 normal. No murmurs, rubs, or gallops.  ABDOMEN: Soft, nontender, nondistended. Bowel sounds present. No organomegaly or mass.  EXTREMITIES: No pedal edema, cyanosis, or clubbing.  NEUROLOGIC: He did not follow commands. and refuses exam.  PSYCHIATRIC: The patient is confused and agitated.  SKIN: No obvious rash, lesion, or ulcer.    LABORATORY PANEL:   CBC  Recent Labs Lab 06/20/15 0453  WBC 9.2  HGB 11.6*  HCT 34.8*  PLT 106*   ------------------------------------------------------------------------------------------------------------------  Chemistries   Recent Labs Lab 06/19/15 1504  06/22/15 0458  NA 142  < > 138  K 3.9  < > 3.2*  CL 104  < > 105  CO2 28  < > 25  GLUCOSE 123*  < > 96  BUN 54*  < > 11  CREATININE 1.44*  < > 0.59*  CALCIUM 9.8  < > 8.7*  MG  --   < > 1.6*  AST 23  --   --   ALT 14*  --   --   ALKPHOS 58  --   --   BILITOT 1.2  --   --   < > = values in this interval not  displayed. ------------------------------------------------------------------------------------------------------------------  Cardiac Enzymes  Recent Labs Lab 06/19/15 1504  TROPONINI <0.03   ------------------------------------------------------------------------------------------------------------------  RADIOLOGY:  No results found.  EKG:   Orders placed or performed during the hospital encounter of 06/19/15  . ED EKG  . ED EKG    ASSESSMENT AND PLAN:   #1 Hypotension and bradycardia likely medication induced:  Hold metoprolol, benazepril and HCTZ. Improved with IV hydration.  #2 Acute renal failure due to dehydration and poor by mouth intake: Improved with IV hydration, avoid nephrotoxic agents.  #3 altered mental status, acute metabolic encephalopathy. likely secondary to Remeron use, patient recently was started on Remeron, since then he is behaving like this as per family. So hold the Remeron at this time. His mental status is improving but had agitation today.  #4 generalized weakness: Per PT, need HHPT.  #5 anorexia: continue ensure, regular diet  * Possible UTI per UA. F/u U/C, on rocephin. *Hypokalemia, potassium is 3.2 today, give potassium supplement and follow-up BMP *Hypomagnesemia, give IV magnesium supplement and follow-up level. * Urinary retention. Patient got Foley cath placed. Per urologist, continue Flomax, keep Foley upon discharge and follow-up as outpatient.  All the records are reviewed and case discussed with Care Management/Social Workerr. Management plans discussed with the patient, his 2 sons and they are in agreement.  CODE STATUS: full code.  TOTAL TIME TAKING CARE OF THIS PATIENT: 39 minutes.  Greater than 50% time was spent on coordination of care and face-to-face counseling.  POSSIBLE D/C IN 2 DAYS, DEPENDING ON CLINICAL CONDITION.   Shaune Pollack M.D on 06/22/2015 at 2:58 PM  Between 7am to 6pm - Pager - [phone]  After 6pm  go to [email or web address] - password [email or web address] [facility]  [place] [facility]  Office  [phone]  CC: Primary care physician; Edwin Bihari, MD

## 2015-06-22 NOTE — Progress Notes (Signed)
Patient has rested quietly tonight. No complaints of pain and no signs of discomfort or distress noted. Patient did attempt to get out of bed multiple times during the night, but staff responded quickly. Nursing staff will continue to monitor. Lamonte Richer, RN

## 2015-06-22 NOTE — Consult Note (Addendum)
 @ 2:31 PM   Edwin Sutton November 24, 1928 [id number]  Referring provider: Dr. Laban Emperor  Chief Complaint  Patient presents with  . Altered Mental Status    HPI: The patient is a 82 year old gentleman with no past GU history who presented with altered mental status. Urology was consulted for approximate 1 L urinary retention. The patient is not at his baseline mental status is unable to provide history this time. Per his family who are in the room, he has nocturia 1 and has never complained of any urinary issues. He is admitted with altered mental status as described above. He has a negative urine culture. His creatinine is at baseline.    PMH: Past Medical History  Diagnosis Date  . Hypertension     Surgical History: History reviewed. No pertinent past surgical history.  Home Medications:    Medication List    ASK your doctor about these medications        acetaminophen 500 MG tablet  Commonly known as:  TYLENOL  Take 500-1,000 mg by mouth every 6 (six) hours as needed for mild pain or headache.     amLODipine 5 MG tablet  Commonly known as:  NORVASC  Take 5 mg by mouth daily.     aspirin EC 81 MG tablet  Take 81 mg by mouth daily.     benazepril 40 MG tablet  Commonly known as:  LOTENSIN  Take 40 mg by mouth daily.     cephALEXin 500 MG capsule  Commonly known as:  KEFLEX  Take 1 capsule (500 mg total) by mouth 4 (four) times daily.     hydrochlorothiazide 25 MG tablet  Commonly known as:  HYDRODIURIL  Take 25 mg by mouth daily.     mirtazapine 7.5 MG tablet  Commonly known as:  REMERON  Take 7.5 mg by mouth at bedtime.     potassium chloride 10 MEQ tablet  Commonly known as:  K-DUR,KLOR-CON  Take 10 mEq by mouth daily.        Allergies: No Known Allergies  Family History: History reviewed. No pertinent family history.  Social History:  reports that he has never smoked. He does not have any smokeless tobacco history on file. He reports that  he does not drink alcohol or use illicit drugs.  ROS: unable to obtain                                        Physical Exam: BP 102/64 mmHg  Pulse 86  Temp(Src) 98.2 F (36.8 C) (Oral)  Resp 16  Ht 6' (1.829 m)  Wt 166 lb (75.297 kg)  BMI 22.51 kg/m2  SpO2 96%  Constitutional:  Alert and oriented, No acute distress. HEENT: [place] AT, moist mucus membranes.  Trachea midline, no masses. Cardiovascular: No clubbing, cyanosis, or edema. Respiratory: Normal respiratory effort, no increased work of breathing. GI: Abdomen is soft, nontender, nondistended, no abdominal masses GU: No CVA tenderness. Normal phallus. Foley in place. Clear urine. Testicles equally bilaterally. Skin: No rashes, bruises or suspicious lesions. Lymph: No cervical or inguinal adenopathy. Neurologic: Grossly intact, no focal deficits, moving all 4 extremities. Psychiatric: Normal mood and affect.  Laboratory Data: Lab Results  Component Value Date   WBC 9.2 06/20/2015   HGB 11.6* 06/20/2015   HCT 34.8* 06/20/2015   MCV 90.3 06/20/2015   PLT 106* 06/20/2015    Lab Results  Component Value Date   CREATININE 0.59* 06/22/2015    No results found for: PSA  No results found for: TESTOSTERONE  No results found for: HGBA1C  Urinalysis    Component Value Date/Time   COLORURINE YELLOW* 06/19/2015 1641   COLORURINE Amber 12/05/2012 1257   APPEARANCEUR CLEAR* 06/19/2015 1641   APPEARANCEUR Turbid 12/05/2012 1257   LABSPEC 1.016 06/19/2015 1641   LABSPEC 1.017 12/05/2012 1257   PHURINE 5.0 06/19/2015 1641   PHURINE 6.0 12/05/2012 1257   GLUCOSEU NEGATIVE 06/19/2015 1641   GLUCOSEU Negative 12/05/2012 1257   HGBUR NEGATIVE 06/19/2015 1641   HGBUR 2+ 12/05/2012 1257   BILIRUBINUR NEGATIVE 06/19/2015 1641   BILIRUBINUR Negative 12/05/2012 1257   KETONESUR NEGATIVE 06/19/2015 1641   KETONESUR Trace 12/05/2012 1257   PROTEINUR NEGATIVE 06/19/2015 1641   PROTEINUR 100 mg/dL  16/03/9603 5409   NITRITE NEGATIVE 06/19/2015 1641   NITRITE Negative 12/05/2012 1257   LEUKOCYTESUR TRACE* 06/19/2015 1641   LEUKOCYTESUR Trace 12/05/2012 1257     Assessment & Plan:   1. Acute urinary retention (large volume) Continue Foley Continue Flomax 0.4 mg daily. The patient should be discharged this medication. Avoid narcotics and anticholinergics as tolerated The patient should be discharged home with a Foley due to his high volume urinary retention. He can follow up with [facility] for trial of void as an outpatient.  Hildred Laser, MD  [facility] [address] [place], [place] [address] [phone]

## 2015-06-22 NOTE — Progress Notes (Signed)
Md notified. Pt has no urinary output last shift. Bladder scanned at . Urinary foley removed yesterday. Orders to place foley catheter for acute urinary retention. Will continue to assess.

## 2015-06-22 NOTE — Progress Notes (Signed)
Secondary to acute urinary retention MD orders urology consult.

## 2015-06-22 NOTE — Progress Notes (Signed)
Physical Therapy Treatment Patient Details Name: Edwin Sutton MRN: [id number] DOB: [birth year] Today's Date: 06-23-15    History of Present Illness      PT Comments    Pt refusal at this time will re-attempt at later time   Follow Up Recommendations        Equipment Recommendations       Recommendations for Other Services       Precautions / Restrictions      Mobility  Bed Mobility                  Transfers                    Ambulation/Gait                 Stairs            Wheelchair Mobility    Modified Rankin (Stroke Patients Only)       Balance                                    Cognition                            Exercises      General Comments        Pertinent Vitals/Pain      Home Living                      Prior Function            PT Goals (current goals can now be found in the care plan section)      Frequency       PT Plan      Co-evaluation             End of Session           Time:  -     Charges:                       G Codes:      Louie Flenner 06-23-15, 9:31 AM Amr Sturtevant, PTA

## 2015-06-23 DIAGNOSIS — R410 Disorientation, unspecified: Secondary | ICD-10-CM

## 2015-06-23 LAB — BASIC METABOLIC PANEL
ANION GAP: 7 (ref 5–15)
BUN: 9 mg/dL (ref 6–20)
CO2: 25 mmol/L (ref 22–32)
Calcium: 8.3 mg/dL — ABNORMAL LOW (ref 8.9–10.3)
Chloride: 103 mmol/L (ref 101–111)
Creatinine, Ser: 0.68 mg/dL (ref 0.61–1.24)
Glucose, Bld: 97 mg/dL (ref 65–99)
POTASSIUM: 3 mmol/L — AB (ref 3.5–5.1)
SODIUM: 135 mmol/L (ref 135–145)

## 2015-06-23 LAB — GLUCOSE, CAPILLARY: Glucose-Capillary: 65 mg/dL (ref 65–99)

## 2015-06-23 LAB — MAGNESIUM: MAGNESIUM: 1.8 mg/dL (ref 1.7–2.4)

## 2015-06-23 MED ORDER — ENSURE ENLIVE PO LIQD
237.0000 mL | Freq: Three times a day (TID) | ORAL | Status: DC
  Administered 2015-06-23 – 2015-06-26 (×8): 237 mL via ORAL

## 2015-06-23 MED ORDER — HALOPERIDOL LACTATE 5 MG/ML IJ SOLN: 0.5000 mg | Freq: Four times a day (QID) | INTRAMUSCULAR | Status: DC | PRN

## 2015-06-23 NOTE — Progress Notes (Signed)
Remains confused and uncooperative at times.  Infrequent attempts to get out of bed this shift.  Foley remains with amber to slightly blood tinged urine.

## 2015-06-23 NOTE — Progress Notes (Signed)
Initial Nutrition Assessment   INTERVENTION:   Medical Food Supplement Therapy: recommend Ensure Enlive po TID, each supplement provides 350 kcal and 20 grams of protein  NUTRITION DIAGNOSIS:   Inadequate oral intake related to lethargy/confusion, acute illness as evidenced by meal completion < 50%.  GOAL:   Patient will meet greater than or equal to 90% of their needs   MONITOR:    (Energy Intake, Anthropometrics, Digestive system, Electrolyte/Renal profile, Cognition)  REASON FOR ASSESSMENT:   Diagnosis    ASSESSMENT:    Pt admitted with ARF, acute metabolic encephalopathy, generalized weakness; pt confused, agitated  Past Medical History  Diagnosis Date  . Hypertension    Diet Order:  Diet Heart Room service appropriate?: Yes; Fluid consistency:: Thin   Energy Intake:  Recorded po intake 27% of meals, eating bites/sips only for last several days.   Food and nutrition related history: unable to assess  Skin:   (stage II buttock)  Last BM:  1/19   Electrolyte and Renal Profile:  Recent Labs Lab 06/21/15 0740 06/22/15 0458 06/23/15 0735  BUN CREATININE 0.66 0.59* 0.68  NA 140 138 135  K 2.9* 3.2* 3.0*  MG 1.5* 1.6* 1.8   Glucose Profile:   Recent Labs  06/21/15 1144 06/22/15 0747 06/23/15 0731  GLUCAP 77 103* 65   Meds: potassium chloride  Nutrition Focused Physical Exam:  Unable to complete Nutrition-Focused physical exam at this time.    Height:   Ht Readings from Last 1 Encounters:  06/19/15 6' (1.829 m)    Weight: based on weight encounters, pt with 16% wt loss in 1 month; unable to ask pt regarding wt status  Wt Readings from Last 1 Encounters:  06/23/15 163 lb 6.4 oz (74.118 kg)    Wt Readings from Last 10 Encounters:  06/23/15 163 lb 6.4 oz (74.118 kg)  05/13/15 194 lb (87.998 kg)    BMI:  Body mass index is 22.16 kg/(m^2).  Estimated Nutritional Needs:   Kcal:  4401-0272 kcals (BEE 1464, 1.3 AF, 1.0-1.2 IF)    Protein:  74-89 g(1.0-1.2 g/kg)   Fluid:  1850-2220 mL (25-30 ml/kg)     HIGH Care Level  Romelle Starcher MS, RD, LDN [phone] Pager  [phone] Weekend/On-Call Pager

## 2015-06-23 NOTE — Care Management Note (Signed)
Case Management Note  Patient Details  Name: Noboru Bidinger MRN: [id number] Date of Birth: 1929/02/28  Subjective/Objective:    Patient more confused and combative per staff. Case discussed with Dr. Imogene Burn. Waiting for PT to reevaluate  For STR appropriateness.                Action/Plan:   Expected Discharge Date:                  Expected Discharge Plan:  Home w Home Health Services  In-House Referral:     Discharge planning Services  CM Consult  Post Acute Care Choice:  Home Health Choice offered to:  Adult Children  DME Arranged:  Bedside commode, Shower stool DME Agency:  [facility]  HH Arranged:  RN, PT, Nurse's Aide, Social Work [employer] Agency:  [facility]  Status of Service:  In process, will continue to follow  Medicare Important Message Given:  Yes Date Medicare IM Given:    Medicare IM give by:    Date Additional Medicare IM Given:    Additional Medicare Important Message give by:     If discussed at Long Length of Stay Meetings, dates discussed:    Additional Comments:  Marily Memos, RN 06/23/2015, 12:26 PM

## 2015-06-23 NOTE — Consult Note (Signed)
[facility] Face-to-Face Psychiatry Consult   Reason for Consult:  Consult for this 82 year old man with multiple medical problems currently reported to be agitated and confused. Referring Physician:  Vallarie Mare Patient Identification: Edwin Sutton MRN:  [id number] Principal Diagnosis: Acute delirium Diagnosis:   Patient Active Problem List   Diagnosis Date Noted  . Acute delirium [R41.0] 06/23/2015  . Pressure ulcer [L89.90] 06/20/2015  . Acute renal failure (ARF) ([place]) [N17.9] 06/19/2015    Total Time spent with patient: 30 minutes  Subjective:   Edwin Sutton is a 82 y.o. male patient admitted with patient was not able to give any subjective information at all.  HPI:  Patient observed and attempted to be interviewed. He was sedated and I could not arouse him to engage in any conversation. Chart reviewed. Labs reviewed. 82 year old man admitted with altered mental status. Sounds like he showed a little bit of improvement but now has slipped back. He is described as being confused. On my evaluation today I found the patient asleep. I spoke his name and touched him gently. He aroused and grunted but was unable to say his name. Look to be in some distress. I decided to allow him to go back to sleep as he did not seem to be able to have a conversation. There is no report of any specific psychiatric symptoms. No evidence of psychotic or mood symptoms.  Medical history: Patient currently presents with acute renal failure. Pressure ulcer, hypotension multiple possible sources of infection.  Past Psychiatric History: I don't see anything in the chart to suggest any past psychiatric history. No evidence of psychiatric hospitalization self injury or psychiatric medicine from the past. There was some report that the patient had been given a low dose of mirtazapine sometime prior to admission and this has been discontinued several days ago. Current condition is extremely unlikely to have anything to do with that  low dose of mirtazapine.  Risk to Self: Is patient at risk for suicide?: No Risk to Others:   Prior Inpatient Therapy:   Prior Outpatient Therapy:    Past Medical History:  Past Medical History  Diagnosis Date  . Hypertension    History reviewed. No pertinent past surgical history. Family History: History reviewed. No pertinent family history. Family Psychiatric  History: Unknown Social History:  History  Alcohol Use No     History  Drug Use No    Social History   Social History  . Marital Status: Widowed    Spouse Name: N/A  . Number of Children: N/A  . Years of Education: N/A   Social History Main Topics  . Smoking status: Never Smoker   . Smokeless tobacco: None  . Alcohol Use: No  . Drug Use: No  . Sexual Activity: No   Other Topics Concern  . None   Social History Narrative   Additional Social History:                          Allergies:  No Known Allergies  Labs:  Results for orders placed or performed during the hospital encounter of 06/19/15 (from the past 48 hour(s))  Basic metabolic panel     Status: Abnormal   Collection Time: 06/22/15  4:58 AM  Result Value Ref Range   Sodium 138 135 - 145 mmol/L   Potassium 3.2 (L) 3.5 - 5.1 mmol/L   Chloride 105 101 - 111 mmol/L   CO2 25 22 - 32 mmol/L   Glucose,  Bld 96 65 - 99 mg/dL   BUN 11 6 - 20 mg/dL   Creatinine, Ser 0.59 (L) 0.61 - 1.24 mg/dL   Calcium 8.7 (L) 8.9 - 10.3 mg/dL   GFR calc non Af Amer >60 >60 mL/min   GFR calc Af Amer >60 >60 mL/min    Comment: (NOTE) The eGFR has been calculated using the CKD EPI equation. This calculation has not been validated in all clinical situations. eGFR's persistently <60 mL/min signify possible Chronic Kidney Disease.    Anion gap 8 5 - 15  Magnesium     Status: Abnormal   Collection Time: 06/22/15  4:58 AM  Result Value Ref Range   Magnesium 1.6 (L) 1.7 - 2.4 mg/dL  Glucose, capillary     Status: Abnormal   Collection Time: 06/22/15  7:47  AM  Result Value Ref Range   Glucose-Capillary 103 (H) 65 - 99 mg/dL   Comment 1 Notify RN   Glucose, capillary     Status: None   Collection Time: 06/23/15  7:31 AM  Result Value Ref Range   Glucose-Capillary 65 65 - 99 mg/dL   Comment 1 Notify RN   Basic metabolic panel     Status: Abnormal   Collection Time: 06/23/15  7:35 AM  Result Value Ref Range   Sodium 135 135 - 145 mmol/L   Potassium 3.0 (L) 3.5 - 5.1 mmol/L   Chloride 103 101 - 111 mmol/L   CO2 25 22 - 32 mmol/L   Glucose, Bld 97 65 - 99 mg/dL   BUN 9 6 - 20 mg/dL   Creatinine, Ser 0.68 0.61 - 1.24 mg/dL   Calcium 8.3 (L) 8.9 - 10.3 mg/dL   GFR calc non Af Amer >60 >60 mL/min   GFR calc Af Amer >60 >60 mL/min    Comment: (NOTE) The eGFR has been calculated using the CKD EPI equation. This calculation has not been validated in all clinical situations. eGFR's persistently <60 mL/min signify possible Chronic Kidney Disease.    Anion gap 7 5 - 15  Magnesium     Status: None   Collection Time: 06/23/15  7:35 AM  Result Value Ref Range   Magnesium 1.8 1.7 - 2.4 mg/dL    Current Facility-Administered Medications  Medication Dose Route Frequency Provider Last Rate Last Dose  . acetaminophen (TYLENOL) tablet 650 mg  650 mg Oral Q6H PRN Epifanio Lesches, MD       Or  . acetaminophen (TYLENOL) suppository 650 mg  650 mg Rectal Q6H PRN Epifanio Lesches, MD      . alum & mag hydroxide-simeth (MAALOX/MYLANTA) 200-200-20 MG/5ML suspension 30 mL  30 mL Oral Q6H PRN Epifanio Lesches, MD      . aspirin EC tablet 81 mg  81 mg Oral Daily Epifanio Lesches, MD   81 mg at 06/23/15 0950  . bisacodyl (DULCOLAX) EC tablet 5 mg  5 mg Oral Daily PRN Epifanio Lesches, MD      . cefTRIAXone (ROCEPHIN) 1 g in dextrose 5 % 50 mL IVPB  1 g Intravenous Q24H Demetrios Loll, MD   1 g at 06/23/15 0947  . docusate sodium (COLACE) capsule 100 mg  100 mg Oral BID Epifanio Lesches, MD   100 mg at 06/23/15 0946  . enoxaparin (LOVENOX)  injection 40 mg  40 mg Subcutaneous Q24H Epifanio Lesches, MD   40 mg at 06/23/15 1708  . feeding supplement (ENSURE ENLIVE) (ENSURE ENLIVE) liquid 237 mL  237 mL Oral TID  WC Demetrios Loll, MD   237 mL at 06/23/15 1708  . ondansetron (ZOFRAN) tablet 4 mg  4 mg Oral Q6H PRN Epifanio Lesches, MD       Or  . ondansetron (ZOFRAN) injection 4 mg  4 mg Intravenous Q6H PRN Epifanio Lesches, MD      . potassium chloride SA (K-DUR,KLOR-CON) CR tablet 40 mEq  40 mEq Oral Once Demetrios Loll, MD   40 mEq at 06/22/15 1021  . tamsulosin (FLOMAX) capsule 0.4 mg  0.4 mg Oral Daily Harrie Foreman, MD   0.4 mg at 06/23/15 0947  . traZODone (DESYREL) tablet 25 mg  25 mg Oral QHS PRN Epifanio Lesches, MD   25 mg at 06/20/15 2107    Musculoskeletal: Strength & Muscle Tone: decreased Gait & Station: unable to stand Patient leans: N/A  Psychiatric Specialty Exam: Review of Systems  Unable to perform ROS: mental acuity    Blood pressure 107/73, pulse 89, temperature 98.9 F (37.2 C), temperature source Oral, resp. rate 18, height 6' (1.829 m), weight 74.118 kg (163 lb 6.4 oz), SpO2 96 %.Body mass index is 22.16 kg/(m^2).  General Appearance: Disheveled  Eye Sport and exercise psychologist::  None  Speech:  Garbled  Volume:  Decreased  Mood:  Not stated  Affect:  None shown as he is asleep  Thought Process:  Negative  Orientation:  Negative  Thought Content:  Negative  Suicidal Thoughts:  Unknown but nothing reported  Homicidal Thoughts:  Unknown but nothing reported  Memory:  Negative  Judgement:  Negative  Insight:  Negative  Psychomotor Activity:  Negative  Concentration:  Negative  Recall:  Negative  Fund of Knowledge:Negative  Language: Negative  Akathisia:  Negative  Handed:  Right  AIMS (if indicated):     Assets:  Social Support  ADL's:  Impaired  Cognition: Impaired,  Severe  Sleep:      Treatment Plan Summary: Medication management and Plan 82 year old man without a past psychiatric history.  Presents with confusion and multiple medical problems. Patient was not arousable to being able to have a interview today. Everything about the history describes a delirium. No indication for any specific psychiatric evaluation. When I saw him this evening the patient was calm and not agitated however if his delirium is becoming agitated I would recommend low doses of IV Haldol and I will put in an order for that. I will follow-up if needed after the weekend.  Disposition: Patient does not meet criteria for psychiatric inpatient admission.  Edwin Sutton 06/23/2015 6:03 PM

## 2015-06-23 NOTE — Care Management Important Message (Signed)
Important Message  Patient Details  Name: Edwin Sutton MRN: [id number] Date of Birth: Jun 09, 1928   Medicare Important Message Given:  Yes    Floreen Teegarden A, RN 06/23/2015, 8:06 AM

## 2015-06-23 NOTE — Progress Notes (Signed)
[facility] [place] at [facility]   PATIENT NAME: Edwin Sutton    MR#:  [id number]  DATE OF BIRTH:  12-15-1928  SUBJECTIVE:  CHIEF COMPLAINT:   Chief Complaint  Patient presents with  . Altered Mental Status  the patient is confused and refused vital sign exam this morning.  REVIEW OF SYSTEMS:  Unable to get ROS.  DRUG ALLERGIES:  No Known Allergies  VITALS:  Blood pressure 107/73, pulse 89, temperature 98.9 F (37.2 C), temperature source Oral, resp. rate 18, height 6' (1.829 m), weight 74.118 kg (163 lb 6.4 oz), SpO2 96 %.  PHYSICAL EXAMINATION:  GENERAL:  82 y.o.-year-old patient lying in the bed with no acute distress.  EYES: No scleral icterus. Extraocular muscles intact.  HEENT: Head atraumatic, normocephalic.  NECK:  Supple, no jugular venous distention. No thyroid enlargement, no tenderness.  LUNGS: Normal breath sounds bilaterally, no wheezing, rales,rhonchi or crepitation. No use of accessory muscles of respiration.  CARDIOVASCULAR: S1, S2 normal. No murmurs, rubs, or gallops.  ABDOMEN: Soft, nontender, nondistended. Bowel sounds present. No organomegaly or mass.  EXTREMITIES: No pedal edema, cyanosis, or clubbing.  NEUROLOGIC: He did not follow commands. and refused exam.  PSYCHIATRIC: The patient is confused and agitated.  SKIN: No obvious rash, lesion, or ulcer.    LABORATORY PANEL:   CBC  Recent Labs Lab 06/20/15 0453  WBC 9.2  HGB 11.6*  HCT 34.8*  PLT 106*   ------------------------------------------------------------------------------------------------------------------  Chemistries   Recent Labs Lab 06/19/15 1504  06/23/15 0735  NA 142  < > 135  K 3.9  < > 3.0*  CL 104  < > 103  CO2 28  < > 25  GLUCOSE 123*  < > 97  BUN 54*  < > 9  CREATININE 1.44*  < > 0.68  CALCIUM 9.8  < > 8.3*  MG  --   < > 1.8  AST 23  --   --   ALT 14*  --   --   ALKPHOS 58  --   --   BILITOT 1.2  --   --   < > = values in this  interval not displayed. ------------------------------------------------------------------------------------------------------------------  Cardiac Enzymes  Recent Labs Lab 06/19/15 1504  TROPONINI <0.03   ------------------------------------------------------------------------------------------------------------------  RADIOLOGY:  No results found.  EKG:   Orders placed or performed during the hospital encounter of 06/19/15  . ED EKG  . ED EKG    ASSESSMENT AND PLAN:   #1 Hypotension and bradycardia likely medication induced:  Hold metoprolol, benazepril and HCTZ. Improved with IV hydration.  #2 Acute renal failure due to dehydration and poor by mouth intake: Improved with IV hydration, avoid nephrotoxic agents.  #3 altered mental status, acute metabolic encephalopathy. likely secondary to Remeron use, patient recently was started on Remeron, since then he is behaving like this as per family. So hold the Remeron at this time. His mental status is improving but has confusion and agitation. Get psychiatry consult.  #4 generalized weakness: Per PT, need HHPT.  #5 anorexia: continue ensure, regular diet  * Possible UTI per UA. F/u U/C, on rocephin. *Hypokalemia, potassium is 3.0 today, give potassium supplement and follow-up BMP *Hypomagnesemia, give IV magnesium supplement and improved. * Urinary retention. Patient got Foley cath placed. Per urologist, continue Flomax, keep Foley upon discharge and follow-up as outpatient.  All the records are reviewed and case discussed with Care Management/Social Workerr. Management plans discussed with the patient, his daughter and they  are in agreement.  CODE STATUS: full code.  TOTAL TIME TAKING CARE OF THIS PATIENT: 35 minutes.  Greater than 50% time was spent on coordination of care and face-to-face counseling.  POSSIBLE D/C IN 2 DAYS, DEPENDING ON CLINICAL CONDITION.   Shaune Pollack M.D on 06/23/2015 at 4:03 PM  Between 7am to  6pm - Pager - [phone]  After 6pm go to [email or web address] - password [email or web address] [facility]  [place] [facility]  Office  [phone]  CC: Primary care physician; Rafael Bihari, MD

## 2015-06-23 NOTE — Telephone Encounter (Signed)
Patient is still inpatient and his daughter will call me once he is home and settled to schd the appt  michelle

## 2015-06-24 LAB — GLUCOSE, CAPILLARY
GLUCOSE-CAPILLARY: 157 mg/dL — AB (ref 65–99)
Glucose-Capillary: 123 mg/dL — ABNORMAL HIGH (ref 65–99)

## 2015-06-24 LAB — BASIC METABOLIC PANEL
Anion gap: 6 (ref 5–15)
BUN: 13 mg/dL (ref 6–20)
CHLORIDE: 106 mmol/L (ref 101–111)
CO2: 27 mmol/L (ref 22–32)
CREATININE: 0.62 mg/dL (ref 0.61–1.24)
Calcium: 8.6 mg/dL — ABNORMAL LOW (ref 8.9–10.3)
GFR calc Af Amer: >60 mL/min (ref >60)
GFR calc non Af Amer: >60 mL/min (ref >60)
GLUCOSE: 98 mg/dL (ref 65–99)
Potassium: 2.8 mmol/L — CL (ref 3.5–5.1)
SODIUM: 139 mmol/L (ref 135–145)

## 2015-06-24 LAB — CULTURE, BLOOD (ROUTINE X 2)
CULTURE: NO GROWTH
Culture: NO GROWTH

## 2015-06-24 MED ORDER — MAGNESIUM SULFATE 2 GM/50ML IV SOLN
2.0000 g | Freq: Once | INTRAVENOUS | Status: AC
  Administered 2015-06-24: 2 g via INTRAVENOUS
  Filled 2015-06-24: qty 50

## 2015-06-24 MED ORDER — POTASSIUM CHLORIDE CRYS ER 20 MEQ PO TBCR
40.0000 meq | EXTENDED_RELEASE_TABLET | Freq: Once | ORAL | Status: AC
  Administered 2015-06-24: 40 meq via ORAL
  Filled 2015-06-24: qty 2

## 2015-06-24 MED ORDER — POTASSIUM CHLORIDE 10 MEQ/100ML IV SOLN
10.0000 meq | INTRAVENOUS | Status: AC
  Administered 2015-06-24 (×2): 10 meq via INTRAVENOUS
  Filled 2015-06-24 (×2): qty 100

## 2015-06-24 NOTE — Progress Notes (Signed)
Pt weak and sleepy. Family at bedside most of day. Appetite only fair. Pt needs assistance and encouragement to eat. Up to bsc with single assist . No c/o pain.  Foley draining. Tele nsr. repleted potassium and magnesium.

## 2015-06-24 NOTE — Progress Notes (Signed)
Patient ID: Edwin Sutton, male   DOB: 08-22-1928, 82 y.o.   MRN: [id number] [facility] PROGRESS NOTE  Edwin Sutton MWN:[id number] DOB: [birth year] DOA: 06/19/2015 PCP: Rafael Bihari, MD  HPI/Subjective: Patient offers no complaints. As per family he was confused when they brought him in. They think his mental status is better now. He required a Foley catheter for urinary retention.  Objective: Filed Vitals:   06/24/15 1000 06/24/15 1130  BP: 102/62 104/63  Pulse: 72 84  Temp: 99.4 F (37.4 C) 98.6 F (37 C)  Resp: 16 20    Filed Weights   06/22/15 0332 06/23/15 0524 06/24/15 0504  Weight: 75.297 kg (166 lb) 74.118 kg (163 lb 6.4 oz) 73.755 kg (162 lb 9.6 oz)    ROS: Review of Systems  Constitutional: Negative for fever and chills.  Eyes: Negative for blurred vision.  Respiratory: Negative for cough and shortness of breath.   Cardiovascular: Negative for chest pain.  Gastrointestinal: Negative for nausea, vomiting, abdominal pain, diarrhea and constipation.  Genitourinary: Negative for dysuria.  Musculoskeletal: Negative for joint pain.  Neurological: Negative for dizziness and headaches.   Exam: Physical Exam  HENT:  Nose: No mucosal edema.  Mouth/Throat: No oropharyngeal exudate or posterior oropharyngeal edema.  Eyes: Conjunctivae, EOM and lids are normal. Pupils are equal, round, and reactive to light.  Neck: No JVD present. Carotid bruit is not present. No edema present. No thyroid mass and no thyromegaly present.  Cardiovascular: S1 normal and S2 normal.  Exam reveals no gallop.   No murmur heard. Pulses:      Dorsalis pedis pulses are 2+ on the right side, and 2+ on the left side.  Respiratory: No respiratory distress. He has no wheezes. He has no rhonchi. He has no rales.  GI: Soft. Bowel sounds are normal. There is no tenderness.  Musculoskeletal:       Right ankle: He exhibits no swelling.       Left ankle: He exhibits no swelling.   Lymphadenopathy:    He has no cervical adenopathy.  Neurological: He is alert. No cranial nerve deficit.  Skin: Skin is warm. No rash noted. Nails show no clubbing.  Psychiatric: He has a normal mood and affect.      Data Reviewed: Basic Metabolic Panel:  Recent Labs Lab 06/20/15 0453 06/21/15 0740 06/22/15 0458 06/23/15 0735 06/24/15 0519  NA 140 140 138 135 139  K 3.5 2.9* 3.2* 3.0* 2.8*  CL 108 109 105 103 106  CO2 GLUCOSE 85 79 96 97 98  BUN 38* CREATININE 0.96 0.66 0.59* 0.68 0.62  CALCIUM 8.7* 8.4* 8.7* 8.3* 8.6*  MG  --  1.5* 1.6* 1.8  --    Liver Function Tests:  Recent Labs Lab 06/19/15 1504  AST 23  ALT 14*  ALKPHOS 58  BILITOT 1.2  PROT 7.8  ALBUMIN 3.2*   CBC:  Recent Labs Lab 06/19/15 1504 06/20/15 0453  WBC 9.8 9.2  HGB 12.0* 11.6*  HCT 36.8* 34.8*  MCV 91.3 90.3  PLT 132* 106*    CBG:  Recent Labs Lab 06/21/15 1144 06/22/15 0747 06/23/15 0731 06/24/15 0729 06/24/15 1131  GLUCAP 77 103* 65 157* 123*    Recent Results (from the past 240 hour(s))  Culture, blood (routine x 2)     Status: None   Collection Time: 06/19/15  3:23 PM  Result Value Ref Range Status   Specimen  Description BLOOD LEFT FATTY CASTS  Final   Special Requests BOTTLES DRAWN AEROBIC AND ANAEROBIC  1CC  Final   Culture NO GROWTH 5 DAYS  Final   Report Status 06/24/2015 FINAL  Final  Culture, blood (routine x 2)     Status: None   Collection Time: 06/19/15  3:40 PM  Result Value Ref Range Status   Specimen Description BLOOD LEFT AC  Final   Special Requests BOTTLES DRAWN AEROBIC AND ANAEROBIC AER6ML ANA  Final   Culture NO GROWTH 5 DAYS  Final   Report Status 06/24/2015 FINAL  Final  Urine culture     Status: None   Collection Time: 06/20/15 11:50 AM  Result Value Ref Range Status   Specimen Description URINE, RANDOM  Final   Special Requests NONE  Final   Culture NO GROWTH 1 DAY  Final   Report Status 06/21/2015 FINAL   Final    Scheduled Meds: . aspirin EC  81 mg Oral Daily  . cefTRIAXone (ROCEPHIN)  IV  1 g Intravenous Q24H  . docusate sodium  100 mg Oral BID  . enoxaparin (LOVENOX) injection  40 mg Subcutaneous Q24H  . feeding supplement (ENSURE ENLIVE)  237 mL Oral TID WC  . potassium chloride  40 mEq Oral Once  . tamsulosin  0.4 mg Oral Daily    Assessment/Plan:  1. Hypotension on admission. The patient was on 3 blood pressure medications. All of these medications were stopped. 2. Acute renal failure likely ATN with hypotension. This improved with IV fluid hydration 3. Severe hypokalemia. I will replace magnesium IV also for hypomagnesemia and oral and IV potassium. 4. Suspected urinary tract infection on Rocephin 5. Urinary retention status post Foley catheter placement on Flomax. 6. Weakness physical therapy evaluation 7. Acute encephalopathy- as per family this is a little bit better.  Code Status:     Code Status Orders        Start     Ordered   06/19/15 1700  Full code   Continuous     06/19/15 1700    Code Status History    Date Active Date Inactive Code Status Order ID Comments User Context   This patient has a current code status but no historical code status.     Family Communication: Family at bedside Disposition Plan: Home once potassium is replaced. Home once evaluated by physical therapy.  Antibiotics:  Rocephin  Time spent: 25 minutes  Alford Highland  [facility]

## 2015-06-25 LAB — GLUCOSE, CAPILLARY: Glucose-Capillary: 113 mg/dL — ABNORMAL HIGH (ref 65–99)

## 2015-06-25 LAB — MAGNESIUM: Magnesium: 1.9 mg/dL (ref 1.7–2.4)

## 2015-06-25 LAB — BASIC METABOLIC PANEL
Anion gap: 4 — ABNORMAL LOW (ref 5–15)
BUN: 14 mg/dL (ref 6–20)
CALCIUM: 8.7 mg/dL — AB (ref 8.9–10.3)
CO2: 30 mmol/L (ref 22–32)
Chloride: 106 mmol/L (ref 101–111)
Creatinine, Ser: 0.55 mg/dL — ABNORMAL LOW (ref 0.61–1.24)
GFR calc Af Amer: >60 mL/min (ref >60)
GLUCOSE: 123 mg/dL — AB (ref 65–99)
POTASSIUM: 3.1 mmol/L — AB (ref 3.5–5.1)
Sodium: 140 mmol/L (ref 135–145)

## 2015-06-25 MED ORDER — MAGNESIUM SULFATE 2 GM/50ML IV SOLN
2.0000 g | Freq: Once | INTRAVENOUS | Status: AC
  Administered 2015-06-25: 2 g via INTRAVENOUS
  Filled 2015-06-25: qty 50

## 2015-06-25 MED ORDER — POTASSIUM CHLORIDE CRYS ER 20 MEQ PO TBCR
40.0000 meq | EXTENDED_RELEASE_TABLET | Freq: Two times a day (BID) | ORAL | Status: DC
  Administered 2015-06-25 (×2): 40 meq via ORAL
  Filled 2015-06-25 (×2): qty 2

## 2015-06-25 NOTE — Progress Notes (Signed)
Pt transferred from room 244.  Family with the patient

## 2015-06-25 NOTE — Progress Notes (Signed)
Patient ID: Edwin Sutton, male   DOB: [birth year], 82 y.o.   MRN: [id number] [facility] PROGRESS NOTE  Lucky Trotta WUJ:[id number] DOB: 1928-07-21 DOA: 06/19/2015 PCP: Rafael Bihari, MD  HPI/Subjective: Patient offers no complaints. Feels okay.  Objective: Filed Vitals:   06/25/15 0619 06/25/15 1103  BP: 111/60 110/69  Pulse: 70 79  Temp: 98.4 F (36.9 C) 97.9 F (36.6 C)  Resp: 18 21    Filed Weights   06/23/15 0524 06/24/15 0504 06/25/15 0619  Weight: 74.118 kg (163 lb 6.4 oz) 73.755 kg (162 lb 9.6 oz) 73.936 kg (163 lb)    ROS: Review of Systems  Constitutional: Negative for fever and chills.  Eyes: Negative for blurred vision.  Respiratory: Negative for cough and shortness of breath.   Cardiovascular: Negative for chest pain.  Gastrointestinal: Negative for nausea, vomiting, abdominal pain, diarrhea and constipation.  Genitourinary: Negative for dysuria.  Musculoskeletal: Negative for joint pain.  Neurological: Negative for dizziness and headaches.   Exam: Physical Exam  HENT:  Nose: No mucosal edema.  Mouth/Throat: No oropharyngeal exudate or posterior oropharyngeal edema.  Eyes: Conjunctivae, EOM and lids are normal. Pupils are equal, round, and reactive to light.  Neck: No JVD present. Carotid bruit is not present. No edema present. No thyroid mass and no thyromegaly present.  Cardiovascular: S1 normal and S2 normal.  Exam reveals no gallop.   No murmur heard. Pulses:      Dorsalis pedis pulses are 2+ on the right side, and 2+ on the left side.  Respiratory: No respiratory distress. He has no wheezes. He has no rhonchi. He has no rales.  GI: Soft. Bowel sounds are normal. There is no tenderness.  Musculoskeletal:       Right ankle: He exhibits no swelling.       Left ankle: He exhibits no swelling.  Lymphadenopathy:    He has no cervical adenopathy.  Neurological: He is alert. No cranial nerve deficit.  Skin: Skin is warm. No rash noted.  Nails show no clubbing.  Psychiatric: He has a normal mood and affect.      Data Reviewed: Basic Metabolic Panel:  Recent Labs Lab 06/21/15 0740 06/22/15 0458 06/23/15 0735 06/24/15 0519 06/25/15 0755  NA 140 138 135 139 140  K 2.9* 3.2* 3.0* 2.8* 3.1*  CL 109 105 103 106 106  CO2 GLUCOSE 79 96 97 98 123*  BUN CREATININE 0.66 0.59* 0.68 0.62 0.55*  CALCIUM 8.4* 8.7* 8.3* 8.6* 8.7*  MG 1.5* 1.6* 1.8  --  1.9   Liver Function Tests:  Recent Labs Lab 06/19/15 1504  AST 23  ALT 14*  ALKPHOS 58  BILITOT 1.2  PROT 7.8  ALBUMIN 3.2*   CBC:  Recent Labs Lab 06/19/15 1504 06/20/15 0453  WBC 9.8 9.2  HGB 12.0* 11.6*  HCT 36.8* 34.8*  MCV 91.3 90.3  PLT 132* 106*    CBG:  Recent Labs Lab 06/22/15 0747 06/23/15 0731 06/24/15 0729 06/24/15 1131 06/25/15 0723  GLUCAP 103* 65 157* 123* 113*    Recent Results (from the past 240 hour(s))  Culture, blood (routine x 2)     Status: None   Collection Time: 06/19/15  3:23 PM  Result Value Ref Range Status   Specimen Description BLOOD LEFT FATTY CASTS  Final   Special Requests BOTTLES DRAWN AEROBIC AND ANAEROBIC  1CC  Final   Culture NO GROWTH 5 DAYS  Final   Report Status 06/24/2015 FINAL  Final  Culture, blood (routine x 2)     Status: None   Collection Time: 06/19/15  3:40 PM  Result Value Ref Range Status   Specimen Description BLOOD LEFT AC  Final   Special Requests BOTTLES DRAWN AEROBIC AND ANAEROBIC AER6ML ANA  Final   Culture NO GROWTH 5 DAYS  Final   Report Status 06/24/2015 FINAL  Final  Urine culture     Status: None   Collection Time: 06/20/15 11:50 AM  Result Value Ref Range Status   Specimen Description URINE, RANDOM  Final   Special Requests NONE  Final   Culture NO GROWTH 1 DAY  Final   Report Status 06/21/2015 FINAL  Final    Scheduled Meds: . aspirin EC  81 mg Oral Daily  . cefTRIAXone (ROCEPHIN)  IV  1 g Intravenous Q24H  . docusate sodium  100 mg  Oral BID  . enoxaparin (LOVENOX) injection  40 mg Subcutaneous Q24H  . feeding supplement (ENSURE ENLIVE)  237 mL Oral TID WC  . potassium chloride  40 mEq Oral BID  . tamsulosin  0.4 mg Oral Daily    Assessment/Plan:  1. Hypotension on admission. The patient was on 3 blood pressure medications. All of these medications were stopped. 2. Acute renal failure likely ATN with hypotension. This improved with IV fluid hydration 3. Hypokalemia and hypomagnesemia- replace magnesium IV again today and oral potassium. 4. Suspected urinary tract infection on Rocephin 5. Urinary retention status post Foley catheter placement on Flomax. 6. Weakness- patient has not walked the entire hospital stay. Reorder physical therapy evaluation. 7. Acute encephalopathy- as per family this is a little bit better.  Code Status:     Code Status Orders        Start     Ordered   06/19/15 1700  Full code   Continuous     06/19/15 1700    Code Status History    Date Active Date Inactive Code Status Order ID Comments User Context   This patient has a current code status but no historical code status.     Family Communication: Spoke with family yesterday Disposition Plan: Home once evaluated by physical therapy.  Antibiotics:  Rocephin  Time spent: 24 minutes  Alford Highland  [facility]

## 2015-06-25 NOTE — Progress Notes (Signed)
Pt had comfortable day. Appetite and mentation proving. Family at bedside. Tele has been d/c'd. And pt to be transferred to rm 151. Report called to erica.

## 2015-06-26 LAB — MAGNESIUM: Magnesium: 1.9 mg/dL (ref 1.7–2.4)

## 2015-06-26 LAB — CREATININE, SERUM
CREATININE: 0.46 mg/dL — AB (ref 0.61–1.24)
GFR calc Af Amer: >60 mL/min (ref >60)

## 2015-06-26 LAB — POTASSIUM: POTASSIUM: 4 mmol/L (ref 3.5–5.1)

## 2015-06-26 LAB — GLUCOSE, CAPILLARY: Glucose-Capillary: 115 mg/dL — ABNORMAL HIGH (ref 65–99)

## 2015-06-26 MED ORDER — TAMSULOSIN HCL 0.4 MG PO CAPS: 0.4000 mg | ORAL_CAPSULE | Freq: Every day | ORAL | Status: DC

## 2015-06-26 MED ORDER — ENSURE ENLIVE PO LIQD: 237.0000 mL | Freq: Three times a day (TID) | ORAL | Status: DC

## 2015-06-26 MED ORDER — CEPHALEXIN 500 MG PO CAPS: 500.0000 mg | ORAL_CAPSULE | Freq: Three times a day (TID) | ORAL | Status: DC

## 2015-06-26 NOTE — Care Management Important Message (Signed)
Important Message  Patient Details  Name: Edwin Sutton MRN: [id number] Date of Birth: 12-17-1928   Medicare Important Message Given:  Yes    Olegario Messier A Virgil Lightner 06/26/2015, 10:55 AM

## 2015-06-26 NOTE — Progress Notes (Signed)
Physical Therapy Treatment Patient Details Name: Edwin Sutton MRN: [id number] DOB: 1929/02/19 Today's Date: 06/26/2015    History of Present Illness Pt admitted for ARF. Pt with history of HTN. Per chart review, pt with increased confusion and activity the past 2 weeks. Appears to be close to baseline at time of evaluation.    PT Comments    Pt is making good progress towards goals. Pt needing slight assist from standing from lower surface as pt has not been out of bed much. Pt motivated to walk out in hallway and does well with reciprocal gait pattern. Pt used rw correctly, advise use at home. Per pt, he has rw available. Pt with incontinent episode out in hallway, needing assist for clean up. Pt safe to dc home with HHPT and use of rw. Per patient, has assist from family.  Follow Up Recommendations  Home health PT     Equipment Recommendations  3in1 (PT)    Recommendations for Other Services       Precautions / Restrictions Precautions Precautions: Fall Restrictions Weight Bearing Restrictions: No    Mobility  Bed Mobility Overal bed mobility: Modified Independent             General bed mobility comments: performed with safe technique with holding bed rails. Once seated at EOB, pt able to sit with independence  Transfers Overall transfer level: Needs assistance Equipment used: Rolling walker (2 wheeled) Transfers: Sit to/from Stand Sit to Stand: Min assist         General transfer comment: sit<>stand with safe technique pushing from seated surface.  Ambulation/Gait Ambulation/Gait assistance: Min guard Ambulation Distance (Feet): 200 Feet Assistive device: Rolling walker (2 wheeled) Gait Pattern/deviations: Step-through pattern     General Gait Details: ambulated using rw and safe technique. Slow gait pattern, however pt reports this is baseline level. Pt able to perform balance tasks during ambulation including looking up/down/left/right without LOB. Pt  also able to carry conversation during ambulation without fatigue. During ambulation, pt with incontinent bowel episode on floor. Pt escorted back to room.   Stairs            Wheelchair Mobility    Modified Rankin (Stroke Patients Only)       Balance                                    Cognition Arousal/Alertness: Awake/alert Behavior During Therapy: WFL for tasks assessed/performed Overall Cognitive Status: Within Functional Limits for tasks assessed                      Exercises Other Exercises Other Exercises: set up and supervision for bathroom activities. Safe technique performed.    General Comments        Pertinent Vitals/Pain Pain Assessment: No/denies pain    Home Living                      Prior Function            PT Goals (current goals can now be found in the care plan section) Acute Rehab PT Goals Patient Stated Goal: to go home PT Goal Formulation: With patient Time For Goal Achievement: 07/04/15 Potential to Achieve Goals: Good Progress towards PT goals: Progressing toward goals    Frequency  Min 2X/week    PT Plan Current plan remains appropriate    Co-evaluation  End of Session Equipment Utilized During Treatment: Gait belt Activity Tolerance: Patient tolerated treatment well Patient left: in chair;with chair alarm set;with nursing/sitter in room     Time: [phone] PT Time Calculation (min) (ACUTE ONLY): 24 min  Charges:  $Gait Training: 8-22 mins $Therapeutic Activity: 8-22 mins                    G Codes:      Armani Gawlik 25-Jul-2015, 10:04 AM  Elizabeth Palau, PT, DPT [phone]

## 2015-06-26 NOTE — Discharge Summary (Signed)
[facility] - [place] at [facility]   PATIENT NAME: Edwin Sutton    MR#:  [id number]  DATE OF BIRTH:  1928/09/19  DATE OF ADMISSION:  06/19/2015 ADMITTING PHYSICIAN: Katha Hamming, MD  DATE OF DISCHARGE: 06/26/2015  1:25 PM  PRIMARY CARE PHYSICIAN: Rafael Bihari, MD    ADMISSION DIAGNOSIS:  Hypotension, unspecified [I95.9] Dehydration [E86.0] Weakness [R53.1]  DISCHARGE DIAGNOSIS:  Active Problems:   Acute renal failure (ARF) (HCC)   Pressure ulcer   Acute delirium   SECONDARY DIAGNOSIS:   Past Medical History  Diagnosis Date  . Hypertension     HOSPITAL COURSE:   1. Hypotension on admission. The patient was on 3 blood pressure medications prior to coming in. All of these medications were stopped. The pressure on the low side on discharge. 2. Acute renal failure likely ATN with hypotension. This improved with IV fluid hydration 3. Hypokalemia and hypomagnesemia- these were replaced during the hospital course. 4. Suspected urinary tract infection on Rocephin. Switched over to by mouth Keflex upon discharge 5. Urinary retention status post Foley catheter placement. Patient is on Flomax. Schedule follow-up with urology as outpatient for Foley catheter removal and voiding trial. 6. Weakness- he did well with physical therapy. Home health recommended 7. Acute encephalopathy- as per family this has improved.  DISCHARGE CONDITIONS:   Fair  CONSULTS OBTAINED:  Treatment Team:  Hildred Laser, MD Audery Amel, MD  DRUG ALLERGIES:  No Known Allergies  DISCHARGE MEDICATIONS:   Discharge Medication List as of 06/26/2015 11:34 AM    START taking these medications   Details  feeding supplement, ENSURE ENLIVE, (ENSURE ENLIVE) LIQD Take 237 mLs by mouth 3 (three) times daily with meals., Starting 06/26/2015, Until Discontinued, Print    tamsulosin (FLOMAX) 0.4 MG CAPS capsule Take 1 capsule (0.4 mg total) by mouth at bedtime.,  Starting 06/26/2015, Until Discontinued, Print      CONTINUE these medications which have CHANGED   Details  cephALEXin (KEFLEX) 500 MG capsule Take 1 capsule (500 mg total) by mouth 3 (three) times daily., Starting 06/26/2015, Until Discontinued, Print      CONTINUE these medications which have NOT CHANGED   Details  acetaminophen (TYLENOL) 500 MG tablet Take 500-1,000 mg by mouth every 6 (six) hours as needed for mild pain or headache. , Until Discontinued, Historical Med    aspirin EC 81 MG tablet Take 81 mg by mouth daily., Until Discontinued, Historical Med    mirtazapine (REMERON) 7.5 MG tablet Take 7.5 mg by mouth at bedtime., Until Discontinued, Historical Med    potassium chloride (K-DUR,KLOR-CON) 10 MEQ tablet Take 10 mEq by mouth daily., Until Discontinued, Historical Med      STOP taking these medications     amLODipine (NORVASC) 5 MG tablet      benazepril (LOTENSIN) 40 MG tablet      hydrochlorothiazide (HYDRODIURIL) 25 MG tablet          DISCHARGE INSTRUCTIONS:   Follow-up PMD one week. Follow-up with urology on Thursday or Friday to take Foley catheter out.  If you experience worsening of your admission symptoms, develop shortness of breath, life threatening emergency, suicidal or homicidal thoughts you must seek medical attention immediately by calling 911 or calling your MD immediately  if symptoms less severe.  You Must read complete instructions/literature along with all the possible adverse reactions/side effects for all the Medicines you take and that have been prescribed to you. Take any new Medicines after  you have completely understood and accept all the possible adverse reactions/side effects.   Please note  You were cared for by a hospitalist during your hospital stay. If you have any questions about your discharge medications or the care you received while you were in the hospital after you are discharged, you can call the unit and asked to speak  with the hospitalist on call if the hospitalist that took care of you is not available. Once you are discharged, your primary care physician will handle any further medical issues. Please note that NO REFILLS for any discharge medications will be authorized once you are discharged, as it is imperative that you return to your primary care physician (or establish a relationship with a primary care physician if you do not have one) for your aftercare needs so that they can reassess your need for medications and monitor your lab values.    Today   CHIEF COMPLAINT:   Chief Complaint  Patient presents with  . Altered Mental Status    HISTORY OF PRESENT ILLNESS:  Edwin Sutton  is a 82 y.o. male brought in with altered mental status and found to have a urinary tract infection and urinary retention.   VITAL SIGNS:  Blood pressure 94/56, pulse 76, temperature 98.9 F (37.2 C), temperature source Oral, resp. rate 17, height 6' (1.829 m), weight 73.936 kg (163 lb), SpO2 100 %.    PHYSICAL EXAMINATION:  GENERAL:  82 y.o.-year-old patient lying in the bed with no acute distress.  EYES: Pupils equal, round, reactive to light and accommodation. No scleral icterus. Extraocular muscles intact.  HEENT: Head atraumatic, normocephalic. Oropharynx and nasopharynx clear.  NECK:  Supple, no jugular venous distention. No thyroid enlargement, no tenderness.  LUNGS: Normal breath sounds bilaterally, no wheezing, rales,rhonchi or crepitation. No use of accessory muscles of respiration.  CARDIOVASCULAR: S1, S2 normal. No murmurs, rubs, or gallops.  ABDOMEN: Soft, non-tender, non-distended. Bowel sounds present. No organomegaly or mass.  EXTREMITIES: No pedal edema, cyanosis, or clubbing.  NEUROLOGIC: Cranial nerves II through XII are intact. Muscle strength 5/5 in all extremities. Sensation intact. Gait not checked.  PSYCHIATRIC: The patient is alert.  SKIN: No obvious rash, lesion, or ulcer.   DATA REVIEW:    CBC  Recent Labs Lab 06/20/15 0453  WBC 9.2  HGB 11.6*  HCT 34.8*  PLT 106*    Chemistries   Recent Labs Lab 06/25/15 0755 06/26/15 0444  NA 140  --   K 3.1* 4.0  CL 106  --   CO2 30  --   GLUCOSE 123*  --   BUN 14  --   CREATININE 0.55* 0.46*  CALCIUM 8.7*  --   MG 1.9 1.9    Microbiology Results  Results for orders placed or performed during the hospital encounter of 06/19/15  Culture, blood (routine x 2)     Status: None   Collection Time: 06/19/15  3:23 PM  Result Value Ref Range Status   Specimen Description BLOOD LEFT FATTY CASTS  Final   Special Requests BOTTLES DRAWN AEROBIC AND ANAEROBIC  1CC  Final   Culture NO GROWTH 5 DAYS  Final   Report Status 06/24/2015 FINAL  Final  Culture, blood (routine x 2)     Status: None   Collection Time: 06/19/15  3:40 PM  Result Value Ref Range Status   Specimen Description BLOOD LEFT AC  Final   Special Requests BOTTLES DRAWN AEROBIC AND ANAEROBIC AER6ML ANA  Final  Culture NO GROWTH 5 DAYS  Final   Report Status 06/24/2015 FINAL  Final  Urine culture     Status: None   Collection Time: 06/20/15 11:50 AM  Result Value Ref Range Status   Specimen Description URINE, RANDOM  Final   Special Requests NONE  Final   Culture NO GROWTH 1 DAY  Final   Report Status 06/21/2015 FINAL  Final    Management plans discussed with the patient, and daughter and they are in agreement.  CODE STATUS:     Code Status Orders        Start     Ordered   06/19/15 1700  Full code   Continuous     06/19/15 1700    Code Status History    Date Active Date Inactive Code Status Order ID Comments User Context   This patient has a current code status but no historical code status.      TOTAL TIME TAKING CARE OF THIS PATIENT: 35 minutes.    Alford Highland M.D on 06/26/2015 at 4:23 PM  Between 7am to 6pm - Pager - [phone]  After 6pm go to [email or web address] - password [email or web address] [facility]  [facility]  Office   [phone]  CC: Primary care physician; Rafael Bihari, MD

## 2015-06-26 NOTE — Care Management (Signed)
Home health arranged through [facility] for [facility] and HHPT after speaking with [name] (daughter) and his sons (at bedside). He believes that a bedside commode was delivered while he was on 2a. I am checking with Will with [facility].

## 2015-06-26 NOTE — Progress Notes (Signed)
Patient being discharge to home with home health. Family here to take home. IV removed, belongings packed. Catheter care education completed. Discharge & Rx instructions given to family.  VSS at time of discharge.

## 2015-06-29 ENCOUNTER — Encounter: Payer: Self-pay | Admitting: *Deleted

## 2015-07-03 ENCOUNTER — Ambulatory Visit: Payer: Self-pay | Admitting: Obstetrics and Gynecology

## 2015-07-04 ENCOUNTER — Ambulatory Visit (INDEPENDENT_AMBULATORY_CARE_PROVIDER_SITE_OTHER): Payer: Commercial Managed Care - HMO | Admitting: Urology

## 2015-07-04 ENCOUNTER — Encounter: Payer: Self-pay | Admitting: Urology

## 2015-07-04 VITALS — BP 130/76 | HR 94 | Ht 72.0 in | Wt 173.5 lb

## 2015-07-04 DIAGNOSIS — R339 Retention of urine, unspecified: Secondary | ICD-10-CM

## 2015-07-04 MED ORDER — TAMSULOSIN HCL 0.4 MG PO CAPS: 0.4000 mg | ORAL_CAPSULE | Freq: Every day | ORAL | Status: DC

## 2015-07-04 NOTE — Progress Notes (Signed)
07/04/2015 11:17 AM   Kathryne Eriksson [birth year] [id number]  Referring provider: Rafael Bihari, MD [phone] [facility] [address] [facility] [place], [place] [address]  Chief Complaint  Patient presents with  . Acute Renal Failure    Cath out  referred from ER    HPI: Patient is an 82 year old African-American male who was found to be in urinary retention upon admission for altered mental status on 06/22/2015.  He was seen in consultation by Dr. Sherryl Barters and was found to have 1 L of urine within his bladder. An indwelling Foley was placed at that time.  His Flomax was continued. He presents today for a voiding trial.  Patient is a poor historian.  His family states that he has frequent urination, nocturia 1, blood in the urine and urinary leakage.  PMH: Past Medical History  Diagnosis Date  . Hypertension   . Arthritis   . HLD (hyperlipidemia)   . Renal failure     Surgical History: Past Surgical History  Procedure Laterality Date  . Total hip arthroplasty Bilateral     Home Medications:    Medication List       This list is accurate as of: 07/04/15 11:59 PM.  Always use your most recent med list.               acetaminophen 500 MG tablet  Commonly known as:  TYLENOL  Take 500-1,000 mg by mouth every 6 (six) hours as needed for mild pain or headache. Reported on 07/04/2015     aspirin EC 81 MG tablet  Take 81 mg by mouth daily. Reported on 07/04/2015     cephALEXin 500 MG capsule  Commonly known as:  KEFLEX  Take 1 capsule (500 mg total) by mouth 3 (three) times daily.     feeding supplement (ENSURE ENLIVE) Liqd  Take 237 mLs by mouth 3 (three) times daily with meals.     mirtazapine 7.5 MG tablet  Commonly known as:  REMERON  Take 7.5 mg by mouth at bedtime.     potassium chloride 10 MEQ tablet  Commonly known as:  K-DUR,KLOR-CON  Take 10 mEq by mouth daily. Reported on 07/04/2015     tamsulosin 0.4 MG Caps capsule  Commonly known as:  FLOMAX  Take  1 capsule (0.4 mg total) by mouth at bedtime.        Allergies: No Known Allergies  Family History: Family History  Problem Relation Age of Onset  . Kidney disease Neg Hx   . Prostate cancer Neg Hx     Social History:  reports that he has never smoked. He does not have any smokeless tobacco history on file. He reports that he does not drink alcohol or use illicit drugs.  ROS: UROLOGY Frequent Urination?: Yes Hard to postpone urination?: No Burning/pain with urination?: No Get up at night to urinate?: Yes Leakage of urine?: Yes Urine stream starts and stops?: No Trouble starting stream?: No Do you have to strain to urinate?: No Blood in urine?: Yes Urinary tract infection?: No Sexually transmitted disease?: No Injury to kidneys or bladder?: No Painful intercourse?: No Weak stream?: No Erection problems?: No Penile pain?: No  Gastrointestinal Nausea?: No Vomiting?: No Indigestion/heartburn?: No Diarrhea?: No Constipation?: No  Constitutional Fever: No Night sweats?: No Weight loss?: Yes Fatigue?: No  Skin Skin rash/lesions?: Yes Itching?: No  Eyes Blurred vision?: No Double vision?: No  Ears/Nose/Throat Sore throat?: No Sinus problems?: No  Hematologic/Lymphatic Swollen glands?: No  Easy bruising?: No  Cardiovascular Leg swelling?: Yes Chest pain?: No  Respiratory Cough?: No Shortness of breath?: No  Endocrine Excessive thirst?: No  Musculoskeletal Back pain?: No Joint pain?: Yes  Neurological Headaches?: No Dizziness?: No  Psychologic Depression?: No Anxiety?: No  Physical Exam: BP 130/76 mmHg  Pulse 94  Ht 6' (1.829 m)  Wt 173 lb 8 oz (78.699 kg)  BMI 23.53 kg/m2  Constitutional: Well nourished. Alert and oriented, No acute distress. HEENT: [place] AT, moist mucus membranes. Trachea midline, no masses. Cardiovascular: No clubbing, cyanosis, or edema. Respiratory: Normal respiratory effort, no increased work of  breathing. Skin: No rashes, bruises or suspicious lesions. Lymph: No cervical or inguinal adenopathy. Neurologic: Grossly intact, no focal deficits, moving all 4 extremities. Psychiatric: Normal mood and affect.  Laboratory Data: Lab Results  Component Value Date   WBC 9.2 06/20/2015   HGB 11.6* 06/20/2015   HCT 34.8* 06/20/2015   MCV 90.3 06/20/2015   PLT 106* 06/20/2015    Lab Results  Component Value Date   CREATININE 0.46* 06/26/2015     Lab Results  Component Value Date   AST 23 06/19/2015   Lab Results  Component Value Date   ALT 14* 06/19/2015      Assessment & Plan:    1. Acute urinary retention:   Patient's Foley is removed today. His son is instructed to encourage the patient to increase his fluid intake. They are to return to our office this afternoon by 4pm if unable to urinate. He is to continue the Flomax. If he is able to urinate on his own, I would like to see them back in 1 month for an I PSS score, PVR and exam.    Return in about 1 month (around 08/01/2015) for IPSS score and PVR.  These notes generated with voice recognition software. I apologize for typographical errors.  Michiel Cowboy, PA-C  [facility] [address] [place], [place] [address] [phone]

## 2015-07-04 NOTE — Progress Notes (Signed)
Catheter Removal  Patient is present today for a catheter removal.  10ml of water was drained from the balloon. A 16FR foley cath was removed from the bladder no complications were noted . Patient tolerated well.  Preformed by: Dallas Schimke CMA  Follow up/ Additional notes: Come back at 3 pm if patient can not void on his own.

## 2015-07-05 DIAGNOSIS — R339 Retention of urine, unspecified: Secondary | ICD-10-CM | POA: Insufficient documentation

## 2015-07-12 ENCOUNTER — Encounter: Payer: Self-pay | Admitting: *Deleted

## 2015-07-12 ENCOUNTER — Observation Stay
Admission: EM | Admit: 2015-07-12 | Discharge: 2015-07-14 | Disposition: A | Discharge status: Home / Self Care | Payer: Commercial Managed Care - HMO | Attending: Internal Medicine | Admitting: Internal Medicine

## 2015-07-12 ENCOUNTER — Other Ambulatory Visit: Payer: Self-pay | Admitting: *Deleted

## 2015-07-12 ENCOUNTER — Emergency Department: Payer: Commercial Managed Care - HMO

## 2015-07-12 DIAGNOSIS — J189 Pneumonia, unspecified organism: Principal | ICD-10-CM | POA: Insufficient documentation

## 2015-07-12 DIAGNOSIS — E876 Hypokalemia: Secondary | ICD-10-CM | POA: Insufficient documentation

## 2015-07-12 DIAGNOSIS — M1991 Primary osteoarthritis, unspecified site: Secondary | ICD-10-CM | POA: Diagnosis not present

## 2015-07-12 DIAGNOSIS — R338 Other retention of urine: Secondary | ICD-10-CM | POA: Diagnosis not present

## 2015-07-12 DIAGNOSIS — N390 Urinary tract infection, site not specified: Secondary | ICD-10-CM | POA: Insufficient documentation

## 2015-07-12 DIAGNOSIS — R0602 Shortness of breath: Secondary | ICD-10-CM | POA: Insufficient documentation

## 2015-07-12 DIAGNOSIS — R531 Weakness: Secondary | ICD-10-CM | POA: Insufficient documentation

## 2015-07-12 DIAGNOSIS — R630 Anorexia: Secondary | ICD-10-CM | POA: Insufficient documentation

## 2015-07-12 DIAGNOSIS — E860 Dehydration: Secondary | ICD-10-CM | POA: Insufficient documentation

## 2015-07-12 DIAGNOSIS — I10 Essential (primary) hypertension: Secondary | ICD-10-CM | POA: Diagnosis not present

## 2015-07-12 DIAGNOSIS — R42 Dizziness and giddiness: Secondary | ICD-10-CM | POA: Diagnosis not present

## 2015-07-12 DIAGNOSIS — E785 Hyperlipidemia, unspecified: Secondary | ICD-10-CM | POA: Diagnosis not present

## 2015-07-12 DIAGNOSIS — N401 Enlarged prostate with lower urinary tract symptoms: Secondary | ICD-10-CM | POA: Diagnosis not present

## 2015-07-12 DIAGNOSIS — Z79899 Other long term (current) drug therapy: Secondary | ICD-10-CM | POA: Diagnosis not present

## 2015-07-12 LAB — CBC
HCT: 30.6 % — ABNORMAL LOW (ref 40.0–52.0)
Hemoglobin: 10.1 g/dL — ABNORMAL LOW (ref 13.0–18.0)
MCH: 29.7 pg (ref 26.0–34.0)
MCHC: 33 g/dL (ref 32.0–36.0)
MCV: 89.9 fL (ref 80.0–100.0)
PLATELETS: 157 10*3/uL (ref 150–440)
RBC: 3.4 MIL/uL — ABNORMAL LOW (ref 4.40–5.90)
RDW: 15.5 % — AB (ref 11.5–14.5)
WBC: 12.7 10*3/uL — AB (ref 3.8–10.6)

## 2015-07-12 LAB — BASIC METABOLIC PANEL
Anion gap: 9 (ref 5–15)
BUN: 35 mg/dL — AB (ref 6–20)
CO2: 27 mmol/L (ref 22–32)
CREATININE: 0.99 mg/dL (ref 0.61–1.24)
Calcium: 9.1 mg/dL (ref 8.9–10.3)
Chloride: 107 mmol/L (ref 101–111)
GFR calc Af Amer: >60 mL/min (ref >60)
Glucose, Bld: 150 mg/dL — ABNORMAL HIGH (ref 65–99)
POTASSIUM: 3.4 mmol/L — AB (ref 3.5–5.1)
SODIUM: 143 mmol/L (ref 135–145)

## 2015-07-12 MED ORDER — LEVOFLOXACIN IN D5W 750 MG/150ML IV SOLN
750.0000 mg | Freq: Once | INTRAVENOUS | Status: AC
  Administered 2015-07-12: 750 mg via INTRAVENOUS
  Filled 2015-07-12: qty 150

## 2015-07-12 MED ORDER — SODIUM CHLORIDE 0.9 % IV SOLN
Freq: Once | INTRAVENOUS | Status: AC
Start: 2015-07-12 — End: 2015-07-12
  Administered 2015-07-12: 22:00:00 via INTRAVENOUS

## 2015-07-12 NOTE — ED Notes (Addendum)
Pt denies CP, SOB, dizziness, abd pain or n/v/d.  Pt sts he is not eating/drinking as he should but that this has been ongoing problem for last 2-3 months.  Pt A/Ox4.  Family at bedside.  NAD.  Pt sts that food does not appeal to him, that food smells bad.

## 2015-07-12 NOTE — Patient Outreach (Signed)
[facility]) Care Management  07/12/2015  Edwin Sutton [birth year] [id number]  Subjective: Telephone call to patient's home, no answer, left HIPAA compliant voicemail message and requested call back.   Telephone call from patient's son Edwin Sutton, HIPAA verified.   Per Epic case review there is a designated party release form on file in [employer], Industrial/product designer authorization to speak with son Edwin Sutton.  Discussed [facility] Care Management services and patient's son is in agreement to receive additional information regarding services on patient's behalf.  Patient's son states, patient is not feeling well today, blood pressure elevated and he just received a call from a nurse at MD's office advise son to take patient to the nearest ED.   Son states he will take patient to [facility], if patient will agree to go.   RNCM advised son that if patient is admitted then [facility] Liaison will follow up with son regarding additional information on [facility] Care Management services.      Objective:  Per Epic case review.  Patient's medical history includes: hyperlipidemia, urinary retention, acute renal failure, and hypertension.   Patient has a foley catheter removed on 07/04/15 during follow up visit with urologist physician assistant Edwin Sutton.  Designated Party Release form in Epic dated 07/04/15 authorizing release of information to [employer] .   Patient hospitalization dates of service 06/19/15 - 06/26/15 for acute renal failure, pressure ulcer, and acute delirium.  Patient had ED visit on 05/23/15 for hallucinations and UTI.   Patient's family declined geriatric psych evaluation.  Patient's hospitalization as verified on [facility] of Care Recommendation Report.    Assessment:  [facility] referral received on 07/10/15.   Referral source: Edwin Sutton or Edwin Sutton.  Referral states patient's son Edwin Sutton or who is scheduled to care of patient  will be primary contact.   Reason for referral: Disease, symptom management, and managing diet.  Patient's son is interested in hear more about [facility] Care Management services.   Patient may be taken to ED per son for further evaluation.   Patient's son also has [facility] Telephonic RNCM's contact number for follow up needed.     Plan:  RNCM will follow up and verify patient's status in Epic within 1 business day.    If patient not admitted will follow up with patient's son for telephonic outreach.    If patient admitted, RNCM will refer patient to [facility] Liaison for follow up.    Edwin Sutton, BSN, CCM [facility] Care Management [facility] Telephonic CM Phone: [phone] Fax: [phone]

## 2015-07-12 NOTE — ED Notes (Signed)
Pt states his home health nurse came and visited and suggested he come to the ER because his BP was high and she thought he might be dehydrated, family states pt was just in hospital because he was not eating and dehydrated, pt denies any pain at this time, awake and alert

## 2015-07-12 NOTE — ED Provider Notes (Signed)
[facility]    ____________________________________________  Time seen: ~2200  I have reviewed the triage vital signs and the nursing notes.   HISTORY  Chief Complaint Hypertension and Dehydration   History limited by: Not Limited   HPI Edwin Sutton is a 82 y.o. male who presented to the emergency department today from home because of home health's concern that the patient might be dehydrated. It sounds like he has not been eating or drinking any normal amount. Family also states that they've noticed that he's been having some difficulty breathing. This is been going on for the past few days. Patient denies any chest pain. No fevers noted at home.     Past Medical History  Diagnosis Date  . Hypertension   . Arthritis   . HLD (hyperlipidemia)   . Renal failure     Patient Active Problem List   Diagnosis Date Noted  . Urinary retention 07/05/2015  . Acute delirium 06/23/2015  . Pressure ulcer 06/20/2015  . Acute renal failure (ARF) (HCC) 06/19/2015  . Breathlessness on exertion 06/01/2015  . Benign essential HTN 05/24/2015  . Combined fat and carbohydrate induced hyperlipemia 05/17/2015  . Paroxysmal atrial fibrillation (HCC) 05/17/2015  . Thrombocytopenia (HCC) 01/16/2015  . Cerebrovascular accident (CVA) (HCC) 08/29/2014    Past Surgical History  Procedure Laterality Date  . Total hip arthroplasty Bilateral     Current Outpatient Rx  Name  Route  Sig  Dispense  Refill  . acetaminophen (TYLENOL) 500 MG tablet   Oral   Take 500-1,000 mg by mouth every 6 (six) hours as needed for mild pain or headache. Reported on 07/04/2015         . aspirin EC 81 MG tablet   Oral   Take 81 mg by mouth daily. Reported on 07/04/2015         . cephALEXin (KEFLEX) 500 MG capsule   Oral   Take 1 capsule (500 mg total) by mouth 3 (three) times daily. Patient not taking: Reported on 07/04/2015   9 capsule   0   .  feeding supplement, ENSURE ENLIVE, (ENSURE ENLIVE) LIQD   Oral   Take 237 mLs by mouth 3 (three) times daily with meals.   237 mL   12   . mirtazapine (REMERON) 7.5 MG tablet   Oral   Take 7.5 mg by mouth at bedtime.         . potassium chloride (K-DUR,KLOR-CON) 10 MEQ tablet   Oral   Take 10 mEq by mouth daily. Reported on 07/04/2015         . tamsulosin (FLOMAX) 0.4 MG CAPS capsule   Oral   Take 1 capsule (0.4 mg total) by mouth at bedtime.   90 capsule   3     Allergies Review of patient's allergies indicates no known allergies.  Family History  Problem Relation Age of Onset  . Kidney disease Neg Hx   . Prostate cancer Neg Hx     Social History Social History  Substance Use Topics  . Smoking status: Never Smoker   . Smokeless tobacco: None  . Alcohol Use: No    Review of Systems  Constitutional: Negative for fever. Cardiovascular: Negative for chest pain. Respiratory: Negative for shortness of breath. Gastrointestinal: Negative for abdominal pain, vomiting and diarrhea. Neurological: Negative for headaches, focal weakness or numbness.  10-point ROS otherwise negative.  ____________________________________________   PHYSICAL EXAM:  VITAL SIGNS: ED Triage Vitals  Enc  Vitals Group     BP 07/12/15 1756 115/69 mmHg     Pulse Rate 07/12/15 1756 90     Resp 07/12/15 1756 18     Temp 07/12/15 1756 98.3 F (36.8 C)     Temp Source 07/12/15 1756 Oral     SpO2 07/12/15 1756 96 %     Weight 07/12/15 1756 174 lb (78.926 kg)     Height 07/12/15 1756 6' (1.829 m)     Head Cir --      Peak Flow --      Pain Score 07/12/15 1757 0   Constitutional: Awake and alert. Thin. No acute distress. Eyes: Conjunctivae are normal. PERRL. Normal extraocular movements. ENT   Head: Normocephalic and atraumatic.   Nose: No congestion/rhinnorhea.   Mouth/Throat: Mucous membranes are moist.   Neck: No stridor. Hematological/Lymphatic/Immunilogical: No  cervical lymphadenopathy. Cardiovascular: Normal rate, regular rhythm.  No murmurs, rubs, or gallops. Respiratory: Normal respiratory effort without tachypnea nor retractions. Breath sounds are clear and equal bilaterally. No wheezes/rales/rhonchi. Gastrointestinal: Soft and nontender. No distention.  Genitourinary: Deferred Musculoskeletal: Normal range of motion in all extremities. No joint effusions.  No lower extremity tenderness nor edema. Neurologic:  Normal speech and language. No gross focal neurologic deficits are appreciated.  Skin:  Skin is warm, dry and intact. No rash noted. Psychiatric: Mood and affect are normal. Speech and behavior are normal. Patient exhibits appropriate insight and judgment.  ____________________________________________    LABS (pertinent positives/negatives)  WBC 12.7 Hgb 10.1 Plt 157 Na 143 K 3.4 Cl 107 BUN 35 Cr 0.99   ____________________________________________   EKG  None  ____________________________________________    RADIOLOGY  CXR IMPRESSION: Focal left infrahilar atelectasis versus developing pneumonia. Clinical correlation and follow-up recommended.   ____________________________________________   PROCEDURES  Procedure(s) performed: None  Critical Care performed: No  ____________________________________________   INITIAL IMPRESSION / ASSESSMENT AND PLAN / ED COURSE  Pertinent labs & imaging results that were available during my care of the patient were reviewed by me and considered in my medical decision making (see chart for details).  Patient presented to the emergency department today because of concerns for weakness and dehydration. Also concerns for some shortness of breath. She did have a leukocytosis and chest x-ray was consistent with possible pneumonia.Will admit to the hospitalist service.  ____________________________________________   FINAL CLINICAL IMPRESSION(S) / ED DIAGNOSES  Final  diagnoses:  Community acquired pneumonia     Phineas Semen, MD 07/13/15 [phone]

## 2015-07-13 ENCOUNTER — Other Ambulatory Visit: Payer: Self-pay | Admitting: *Deleted

## 2015-07-13 DIAGNOSIS — J189 Pneumonia, unspecified organism: Secondary | ICD-10-CM | POA: Diagnosis present

## 2015-07-13 LAB — CBC
HEMATOCRIT: 26.5 % — AB (ref 40.0–52.0)
HEMOGLOBIN: 9 g/dL — AB (ref 13.0–18.0)
MCH: 30.3 pg (ref 26.0–34.0)
MCHC: 33.8 g/dL (ref 32.0–36.0)
MCV: 89.6 fL (ref 80.0–100.0)
Platelets: 139 10*3/uL — ABNORMAL LOW (ref 150–440)
RBC: 2.96 MIL/uL — ABNORMAL LOW (ref 4.40–5.90)
RDW: 14.8 % — AB (ref 11.5–14.5)
WBC: 10.3 10*3/uL (ref 3.8–10.6)

## 2015-07-13 LAB — BASIC METABOLIC PANEL
Anion gap: 6 (ref 5–15)
BUN: 28 mg/dL — AB (ref 6–20)
CHLORIDE: 110 mmol/L (ref 101–111)
CO2: 28 mmol/L (ref 22–32)
CREATININE: 0.84 mg/dL (ref 0.61–1.24)
Calcium: 8.5 mg/dL — ABNORMAL LOW (ref 8.9–10.3)
GFR calc Af Amer: >60 mL/min (ref >60)
GFR calc non Af Amer: >60 mL/min (ref >60)
GLUCOSE: 106 mg/dL — AB (ref 65–99)
Potassium: 3.2 mmol/L — ABNORMAL LOW (ref 3.5–5.1)
Sodium: 144 mmol/L (ref 135–145)

## 2015-07-13 LAB — URINALYSIS COMPLETE WITH MICROSCOPIC (ARMC ONLY)
BILIRUBIN URINE: NEGATIVE
Bacteria, UA: NONE SEEN
Glucose, UA: NEGATIVE mg/dL
KETONES UR: NEGATIVE mg/dL
NITRITE: NEGATIVE
PH: 5 (ref 5.0–8.0)
PROTEIN: NEGATIVE mg/dL
SPECIFIC GRAVITY, URINE: 1.015 (ref 1.005–1.030)
Squamous Epithelial / LPF: NONE SEEN

## 2015-07-13 MED ORDER — LEVOFLOXACIN IN D5W 750 MG/150ML IV SOLN
750.0000 mg | INTRAVENOUS | Status: DC
  Administered 2015-07-14: 750 mg via INTRAVENOUS
  Filled 2015-07-13: qty 150

## 2015-07-13 MED ORDER — ALBUTEROL SULFATE (2.5 MG/3ML) 0.083% IN NEBU: 2.5000 mg | INHALATION_SOLUTION | RESPIRATORY_TRACT | Status: DC | PRN

## 2015-07-13 MED ORDER — LEVOFLOXACIN IN D5W 750 MG/150ML IV SOLN
750.0000 mg | Freq: Once | INTRAVENOUS | Status: AC
Start: 2015-07-13 — End: 2015-07-13
  Administered 2015-07-13: 750 mg via INTRAVENOUS
  Filled 2015-07-13: qty 150

## 2015-07-13 MED ORDER — POTASSIUM CHLORIDE 20 MEQ/15ML (10%) PO SOLN
40.0000 meq | Freq: Once | ORAL | Status: AC
  Administered 2015-07-13: 40 meq via ORAL
  Filled 2015-07-13: qty 30

## 2015-07-13 MED ORDER — SODIUM CHLORIDE 0.9 % IV SOLN
INTRAVENOUS | Status: DC
  Administered 2015-07-13 (×2): via INTRAVENOUS

## 2015-07-13 MED ORDER — POTASSIUM CHLORIDE IN NACL 20-0.45 MEQ/L-% IV SOLN
INTRAVENOUS | Status: DC
  Administered 2015-07-13: 04:00:00 via INTRAVENOUS
  Filled 2015-07-13 (×3): qty 1000

## 2015-07-13 MED ORDER — POLYETHYLENE GLYCOL 3350 17 G PO PACK: 17.0000 g | PACK | Freq: Every day | ORAL | Status: DC | PRN

## 2015-07-13 MED ORDER — ACETAMINOPHEN 650 MG RE SUPP: 650.0000 mg | Freq: Four times a day (QID) | RECTAL | Status: DC | PRN

## 2015-07-13 MED ORDER — DOCUSATE SODIUM 100 MG PO CAPS
100.0000 mg | ORAL_CAPSULE | Freq: Two times a day (BID) | ORAL | Status: DC
  Administered 2015-07-13 – 2015-07-14 (×3): 100 mg via ORAL
  Filled 2015-07-13 (×3): qty 1

## 2015-07-13 MED ORDER — ONDANSETRON HCL 4 MG PO TABS: 4.0000 mg | ORAL_TABLET | Freq: Four times a day (QID) | ORAL | Status: DC | PRN

## 2015-07-13 MED ORDER — ENOXAPARIN SODIUM 40 MG/0.4ML ~~LOC~~ SOLN
40.0000 mg | SUBCUTANEOUS | Status: DC
  Administered 2015-07-13: 40 mg via SUBCUTANEOUS
  Filled 2015-07-13: qty 0.4

## 2015-07-13 MED ORDER — ENSURE ENLIVE PO LIQD
237.0000 mL | Freq: Two times a day (BID) | ORAL | Status: DC
  Administered 2015-07-13 – 2015-07-14 (×3): 237 mL via ORAL

## 2015-07-13 MED ORDER — ENOXAPARIN SODIUM 30 MG/0.3ML ~~LOC~~ SOLN: 30.0000 mg | SUBCUTANEOUS | Status: DC

## 2015-07-13 MED ORDER — ONDANSETRON HCL 4 MG/2ML IJ SOLN
4.0000 mg | Freq: Four times a day (QID) | INTRAMUSCULAR | Status: DC | PRN
Start: 2015-07-13 — End: 2015-07-14

## 2015-07-13 MED ORDER — ACETAMINOPHEN 325 MG PO TABS: 650.0000 mg | ORAL_TABLET | Freq: Four times a day (QID) | ORAL | Status: DC | PRN

## 2015-07-13 NOTE — Care Management Note (Signed)
Case Management Note  Patient Details  Name: Edwin Sutton MRN: [id number] Date of Birth: 04/26/1929  Subjective/Objective:                   RNCM assessment for discharge planning. Spoke with daughter, Edwin Sutton by phone. Edwin Sutton is patient's financial caregiver. She states patient lives at home with his 2 sons, Edwin Sutton and Edwin Sutton. Edwin Sutton states they work together but she could be contacted with any needs for patient. He has a walker and per Edwin Sutton he uses however, patient says he "don't". Patient is open to [facility] RN, PT. The daughter denies issues with patient obtaining medications. Marland Kitchen PCP is Dr. Yates Sutton.He is [employer] OBS which will not require inpatient or 3 night stay however PT would have to recommend SNF although daughter and patient would like for him to return home.    Action/Plane Notified Edwin Sutton at [facility] of patient's [facility].  Expected Discharge Date:                  Expected Discharge Plan:     In-House Referral:     Discharge planning Services  CM Consult  Post Acute Care Choice:  Home Health Choice offered to:  Patient, Adult Children  DME Arranged:    DME Agency:     HH Arranged:    HH Agency:  [facility]  Status of Service:  In process, will continue to follow  Medicare Important Message Given:    Date Medicare IM Given:    Medicare IM give by:    Date Additional Medicare IM Given:    Additional Medicare Important Message give by:     If discussed at Long Length of Stay Meetings, dates discussed:    Additional Comments:  Edwin Siad, RN 07/13/2015, 9:13 AM

## 2015-07-13 NOTE — Progress Notes (Signed)
Pharmacy Antibiotic Note  Leandrew Keech is a 82 y.o. male admitted on 07/12/2015 with pneumonia.  Pharmacy has been consulted for Levaquin dosing.  Plan: Blood cx pending UA: negative CXR: left atelectasis vs. Pneumonia  Levaquin 750 mg IV q 24 hours ordered.  Height: 6' (182.9 cm) Weight: 174 lb (78.926 kg) IBW/kg (Calculated) : 77.6  Temp (24hrs), Avg:98.3 F (36.8 C), Min:98.3 F (36.8 C), Max:98.3 F (36.8 C)   Recent Labs Lab 07/12/15 1758  WBC 12.7*  CREATININE 0.99    Estimated Creatinine Clearance: 61 mL/min (by C-G formula based on Cr of 0.99).    No Known Allergies  Antimicrobials this admission:  Dose adjustments this admission:   Microbiology results:  BCx:   UCx:    Sputum:    MRSA PCR:   Thank you for allowing pharmacy to be a part of this patient's care.  Judee Hennick S 07/13/2015 3:06 AM

## 2015-07-13 NOTE — Progress Notes (Signed)
[facility] [place] [facility]   PATIENT NAME: Edwin Sutton    MR#:  [id number]  DATE OF BIRTH:  May 14, 1929  SUBJECTIVE:   Patient appears a little confused this morning. Acute events overnight.  REVIEW OF SYSTEMS:    Review of Systems  Unable to perform ROS   Tolerating Diet: yes      DRUG ALLERGIES:  No Known Allergies  VITALS:  Blood pressure 108/72, pulse 80, temperature 98.4 F (36.9 C), temperature source Oral, resp. rate 18, height 6' (1.829 m), weight 81.012 kg (178 lb 9.6 oz), SpO2 97 %.  PHYSICAL EXAMINATION:   Physical Exam  Constitutional: He is well-developed, well-nourished, and in no distress. No distress.  HENT:  Head: Normocephalic.  Eyes: No scleral icterus.  Neck: Normal range of motion. Neck supple. No JVD present. No tracheal deviation present.  Cardiovascular: Normal rate, regular rhythm and normal heart sounds.  Exam reveals no gallop and no friction rub.   No murmur heard. Pulmonary/Chest: Effort normal and breath sounds normal. No respiratory distress. He has no wheezes. He has no rales. He exhibits no tenderness.  Abdominal: Soft. Bowel sounds are normal. He exhibits no distension and no mass. There is no tenderness. There is no rebound and no guarding.  Musculoskeletal: Normal range of motion. He exhibits no edema.  Neurological: He is alert.  Skin: Skin is warm. No rash noted. No erythema.  Psychiatric: Affect normal.      LABORATORY PANEL:   CBC  Recent Labs Lab 07/13/15 0551  WBC 10.3  HGB 9.0*  HCT 26.5*  PLT 139*   ------------------------------------------------------------------------------------------------------------------  Chemistries   Recent Labs Lab 07/13/15 0551  NA 144  K 3.2*  CL 110  CO2 28  GLUCOSE 106*  BUN 28*  CREATININE 0.84  CALCIUM 8.5*    ------------------------------------------------------------------------------------------------------------------  Cardiac Enzymes No results for input(s): TROPONINI in the last 168 hours. ------------------------------------------------------------------------------------------------------------------  RADIOLOGY:  Dg Chest 2 View  07/12/2015  CLINICAL DATA:  82 year old male with shortness of breath EXAM: CHEST  2 VIEW COMPARISON:  Radiograph dated 06/19/2015 FINDINGS: Two views of the chest demonstrate a focal area of increased density in the left infrahilar region which may represent atelectatic changes. Developing pneumonia is not excluded. Left lung base linear and platelike atelectatic changes noted. The right lung is clear. No pleural effusion or pneumothorax. The cardiac silhouette is within normal limits. The thoracic aorta is tortuous. No acute osseous pathology. IMPRESSION: Focal left infrahilar atelectasis versus developing pneumonia. Clinical correlation and follow-up recommended. Electronically Signed   By: Elgie Collard M.D.   On: 07/12/2015 22:32     ASSESSMENT AND PLAN:   82 year old male with essential hypertension off medications since last hospitalization due to low blood pressure and hyperlipidemia who presents with weakness and loss of appetite.  1.left lower lobe community-acquired pneumonia:Patient is not hypoxic and afebrile.Continue Levaquin. Follow-up on blood cultures.  2. Generalized weakness with dehydration: Physical therapy consult has been requested.  3. Hypokalemia: Potassium will be repleted.  4.History of hypertension:  His blood pressure medications were discontinued due to low blood pressure and last hospital stay. Blood pressure is low normal. I would not restart medications.        CODE STATUS: FULL  TOTAL TIME TAKING CARE OF THIS PATIENT: 30 minutes.     POSSIBLE D/C tomorrow, DEPENDING ON CLINICAL CONDITION.   Ancel Easler M.D on  07/13/2015 at 10:30 AM  Between 7am to 6pm - Pager - [phone]  After 6pm go to [email or web address] - password [email or web address]  [facility]  Office  [phone]  CC: Primary care physician; Rafael Bihari, MD  Note: This dictation was prepared with Dragon dictation along with smaller phrase technology. Any transcriptional errors that result from this process are unintentional.

## 2015-07-13 NOTE — Care Management (Signed)
Patient was a recent discharge to home with [facility]. I have notified Barbara Cower with [facility] of patient admission. PT evaluation might be beneficial- please assess.

## 2015-07-13 NOTE — Patient Outreach (Addendum)
[facility]) Care Management  07/13/2015  Edwin Sutton 1928-07-20 [id number]  Subjective: Telephone call to Edwin Sutton [facility] Liaison, advised patient's son may be interested in obtaining [facility] Care Management services on patient's behalf and is currently inpatient at [facility].   Telephonic RNCM advised  will send an in-basket message with update to Edwin Sutton [facility] Care Management RNCM covering [facility].   Objective: Per Epic case review. Patient's medical history includes: hyperlipidemia, urinary retention, acute renal failure, and hypertension. Patient has a foley catheter removed on 07/04/15 during follow up visit with urologist physician assistant Edwin Sutton. Designated Party Release form in Epic dated 07/04/15 authorizing release of information to [employer] . Patient hospitalization dates of service 06/19/15 - 06/26/15 for acute renal failure, pressure ulcer, and acute delirium. Patient had ED visit on 05/23/15 for hallucinations and UTI. Patient's family declined geriatric psych evaluation. Patient's hospitalization as verified on [facility] of Care Recommendation Report.   Per Epic case review: patient was admitted on 07/12/15 to [facility] with pneumonia.    Assessment: [facility] referral received on 07/10/15. Referral source: Edwin Sutton or Edwin Sutton. Referral states patient's son Edwin Sutton or who is scheduled to care of patient will be primary contact. Reason for referral: Disease, symptom management, and managing diet. Patient's son Edwin Sutton)  is interested in hearing more about [facility] Care Management services. Patient was admitted on 07/12/15 to [facility] with pneumonia.    No further telephonic RNCM needs at this time.     Plan: RNCM will refer patient to [facility] Liaison for follow up.   Edwin Sutton H.  Gardiner Barefoot, BSN, CCM [facility] Care Management [facility] Telephonic CM Phone: [phone] Fax: [phone]

## 2015-07-13 NOTE — Evaluation (Signed)
Physical Therapy Evaluation Patient Details Name: Edwin Sutton MRN: [id number] DOB: April 30, 1929 Today's Date: 07/13/2015   History of Present Illness  presented to ER secondary to weakness, dizziness; admitted for L-lobe, community acquired PNA.  Clinical Impression  Upon evaluation, patient alert and oriented to basic information; follows simple commands and demonstrates fair insight/safety awareness.  Bilat UE/LE strength and ROM grossly WFL and symmetrical for basic transfers and mobility; however, globally weak and deconditioned due to acute illness.  Able to complete all basic transfers and gait (20') without assist device, min assist, but noted to constantly reach/grab for walls/furniture for external stabilization.  Limited functional reach (4") indicative of increased fall risk.  Additional mobility assessed with use of RW, with performance improving to cga/close sup (gait x100', 10' walk time in 9 seconds). Noted improvement in balance, stability and overall indep with use of RW.  Do recommend continued use of RW with all mobility at this time.  Patient voices agreement. Vitals stable and WFL throughout session; maintains sats >92% on RA at rest and with exertion. Would benefit from skilled PT to address above deficits and promote optimal return to PLOF; Recommend transition to HHPT upon discharge from acute hospitalization.     Follow Up Recommendations Home health PT;Supervision/Assistance - 24 hour    Equipment Recommendations       Recommendations for Other Services       Precautions / Restrictions Precautions Precautions: Fall Restrictions Weight Bearing Restrictions: No      Mobility  Bed Mobility Overal bed mobility: Modified Independent             General bed mobility comments: seated edge of bed upon arrival to session  Transfers Overall transfer level: Needs assistance Equipment used: None Transfers: Sit to/from Stand Sit to Stand: Min guard          General transfer comment: multiple attempts required to complete movement transitions; requires UE support and use of bilat UEs with all attempts  Ambulation/Gait Ambulation/Gait assistance: Min guard;Min assist Ambulation Distance (Feet): 20 Feet Assistive device: None       General Gait Details: broad BOS, constantly reaching for walls/furniture for external stabilization without use of assist device  Stairs            Wheelchair Mobility    Modified Rankin (Stroke Patients Only)       Balance Overall balance assessment: Needs assistance Sitting-balance support: No upper extremity supported;Feet supported       Standing balance support: No upper extremity supported Standing balance-Leahy Scale: Fair                               Pertinent Vitals/Pain Pain Assessment: No/denies pain    Home Living Family/patient expects to be discharged to:: Private residence Living Arrangements: Children Available Help at Discharge: Family;Available 24 hours/day Type of Home: House Home Access: Stairs to enter Entrance Stairs-Rails: Left Entrance Stairs-Number of Steps: 3 Home Layout: One level Home Equipment: Walker - 2 wheels;Cane - single point      Prior Function Level of Independence: Independent with assistive device(s)         Comments: Indep with ADLs and household activities, intermittent use of RW     Hand Dominance        Extremity/Trunk Assessment   Upper Extremity Assessment: Overall WFL for tasks assessed           Lower Extremity Assessment: Generalized weakness (grossly 4/5 throughout)  Communication   Communication: No difficulties  Cognition Arousal/Alertness: Awake/alert Behavior During Therapy: WFL for tasks assessed/performed Overall Cognitive Status: Within Functional Limits for tasks assessed                      General Comments      Exercises Other Exercises Other Exercises: Toilet  transfer, ambulatory without assist device, cga/min assist; limited functional reach outside immediate BOS (approx 4") due to higher level balance deficits.  Constantly reaching/grabbing for walls/furniture throughout Other Exercises: Gait x100' with RW, cga/close sup--improved safety, stability and overall fluidity with use of RW.  Recommend continued use of RW with all mobility at this time; patient voiced understanding.      Assessment/Plan    PT Assessment Patient needs continued PT services  PT Diagnosis Difficulty walking;Generalized weakness   PT Problem List Decreased strength;Decreased activity tolerance;Decreased mobility;Decreased balance;Cardiopulmonary status limiting activity  PT Treatment Interventions Gait training;Therapeutic exercise;Balance training;Stair training;Functional mobility training;Therapeutic activities;Patient/family education   PT Goals (Current goals can be found in the Care Plan section) Acute Rehab PT Goals Patient Stated Goal: to go home PT Goal Formulation: With patient Time For Goal Achievement: 07/27/15 Potential to Achieve Goals: Good    Frequency Min 2X/week   Barriers to discharge        Co-evaluation               End of Session Equipment Utilized During Treatment: Gait belt Activity Tolerance: Patient tolerated treatment well Patient left: in chair;with chair alarm set;with nursing/sitter in room Nurse Communication: Mobility status    Functional Assessment Tool Used: clinical judgement, 10' walk time Functional Limitation: Mobility: Walking and moving around Mobility: Walking and Moving Around Current Status [phone]): At least 20 percent but less than 40 percent impaired, limited or restricted Mobility: Walking and Moving Around Goal Status [phone]): At least 1 percent but less than 20 percent impaired, limited or restricted    Time: 0981-1914 PT Time Calculation (min) (ACUTE ONLY): 18 min   Charges:   PT Evaluation $PT  Eval Low Complexity: 1 Procedure PT Treatments $Gait Training: 8-22 mins   PT G Codes:   PT G-Codes **NOT FOR INPATIENT CLASS** Functional Assessment Tool Used: clinical judgement, 10' walk time Functional Limitation: Mobility: Walking and moving around Mobility: Walking and Moving Around Current Status (N8295): At least 20 percent but less than 40 percent impaired, limited or restricted Mobility: Walking and Moving Around Goal Status [phone]): At least 1 percent but less than 20 percent impaired, limited or restricted    Tex Conroy H. Manson Passey, PT, DPT, NCS 07/13/2015, 10:33 AM [phone]

## 2015-07-13 NOTE — H&P (Signed)
[facility] - [place] at [facility]   PATIENT NAME: Edwin Sutton    MR#:  [id number]  DATE OF BIRTH:  28-Sep-1928  DATE OF ADMISSION:  07/12/2015  PRIMARY CARE PHYSICIAN: Rafael Bihari, MD   REQUESTING/REFERRING PHYSICIAN: Dr. Derrill Kay  CHIEF COMPLAINT:   Chief Complaint  Patient presents with  . Hypertension  . Dehydration    HISTORY OF PRESENT ILLNESS:  Edwin Sutton  is a 82 y.o. male with a known history of hypertension, BPH recently in the hospital for UTI and discharged on Keflex returns to the emergency room due to weakness, dizziness, loss of appetite. He has had a mild dry cough. Here in the emergency room he has been found to have elevated WBC. Chest x-ray showing left-sided pneumonia. No orthopnea. Has mild lower extremity edema. No sick contacts.  PAST MEDICAL HISTORY:   Past Medical History  Diagnosis Date  . Hypertension   . Arthritis   . HLD (hyperlipidemia)   . Renal failure     PAST SURGICAL HISTORY:   Past Surgical History  Procedure Laterality Date  . Total hip arthroplasty Bilateral     SOCIAL HISTORY:   Social History  Substance Use Topics  . Smoking status: Never Smoker   . Smokeless tobacco: Not on file  . Alcohol Use: No    FAMILY HISTORY:   Family History  Problem Relation Age of Onset  . Kidney disease Neg Hx   . Prostate cancer Neg Hx     DRUG ALLERGIES:  No Known Allergies  REVIEW OF SYSTEMS:   Review of Systems  Constitutional: Positive for chills, weight loss and malaise/fatigue. Negative for fever.  HENT: Negative for hearing loss and nosebleeds.   Eyes: Negative for blurred vision, double vision and pain.  Respiratory: Positive for cough. Negative for hemoptysis, sputum production, shortness of breath and wheezing.   Cardiovascular: Negative for chest pain, palpitations, orthopnea and leg swelling.  Gastrointestinal: Positive for nausea. Negative for vomiting, abdominal pain, diarrhea  and constipation.  Genitourinary: Negative for dysuria and hematuria.  Musculoskeletal: Positive for myalgias. Negative for back pain and falls.  Skin: Negative for rash.  Neurological: Positive for dizziness and weakness. Negative for tremors, sensory change, speech change, focal weakness, seizures and headaches.  Endo/Heme/Allergies: Does not bruise/bleed easily.  Psychiatric/Behavioral: Negative for depression and memory loss. The patient is not nervous/anxious.     MEDICATIONS AT HOME:   Prior to Admission medications   Medication Sig Start Date End Date Taking? Authorizing Provider  acetaminophen (TYLENOL) 500 MG tablet Take 500-1,000 mg by mouth every 6 (six) hours as needed for mild pain or headache. Reported on 07/04/2015   Yes Historical Provider, MD  feeding supplement, ENSURE ENLIVE, (ENSURE ENLIVE) LIQD Take 237 mLs by mouth 3 (three) times daily with meals. 06/26/15  Yes Richard Renae Gloss, MD  mirtazapine (REMERON) 7.5 MG tablet Take 7.5 mg by mouth at bedtime.   Yes Historical Provider, MD  tamsulosin (FLOMAX) 0.4 MG CAPS capsule Take 1 capsule (0.4 mg total) by mouth at bedtime. 07/04/15  Yes Shannon A McGowan, PA-C  cephALEXin (KEFLEX) 500 MG capsule Take 1 capsule (500 mg total) by mouth 3 (three) times daily. Patient not taking: Reported on 07/04/2015 06/26/15   Alford Highland, MD      VITAL SIGNS:  Blood pressure 102/81, pulse 86, temperature 98.3 F (36.8 C), temperature source Oral, resp. rate 18, height 6' (1.829 m), weight 78.926 kg (174 lb), SpO2 98 %.  PHYSICAL  EXAMINATION:  Physical Exam  GENERAL:  82 y.o.-year-old patient lying in the bed with no acute distress.  EYES: Pupils equal, round, reactive to light and accommodation. No scleral icterus. Extraocular muscles intact.  HEENT: Head atraumatic, normocephalic. Oropharynx and nasopharynx clear. No oropharyngeal erythema, moist oral mucosa  NECK:  Supple, no jugular venous distention. No thyroid enlargement, no  tenderness.  LUNGS: Normal breath sounds bilaterally, no wheezing, rales, rhonchi. No use of accessory muscles of respiration.  CARDIOVASCULAR: S1, S2 normal. No murmurs, rubs, or gallops.  ABDOMEN: Soft, nontender, nondistended. Bowel sounds present. No organomegaly or mass.  EXTREMITIES: No pedal edema, cyanosis, or clubbing. + 2 pedal & radial pulses b/l.   NEUROLOGIC: Cranial nerves II through XII are intact. No focal Motor or sensory deficits appreciated b/l PSYCHIATRIC: The patient is alert and awake SKIN: No obvious rash, lesion, or ulcer.   LABORATORY PANEL:   CBC  Recent Labs Lab 07/12/15 1758  WBC 12.7*  HGB 10.1*  HCT 30.6*  PLT 157   ------------------------------------------------------------------------------------------------------------------  Chemistries   Recent Labs Lab 07/12/15 1758  NA 143  K 3.4*  CL 107  CO2 27  GLUCOSE 150*  BUN 35*  CREATININE 0.99  CALCIUM 9.1   ------------------------------------------------------------------------------------------------------------------  Cardiac Enzymes No results for input(s): TROPONINI in the last 168 hours. ------------------------------------------------------------------------------------------------------------------  RADIOLOGY:  Dg Chest 2 View  07/12/2015  CLINICAL DATA:  82 year old male with shortness of breath EXAM: CHEST  2 VIEW COMPARISON:  Radiograph dated 06/19/2015 FINDINGS: Two views of the chest demonstrate a focal area of increased density in the left infrahilar region which may represent atelectatic changes. Developing pneumonia is not excluded. Left lung base linear and platelike atelectatic changes noted. The right lung is clear. No pleural effusion or pneumothorax. The cardiac silhouette is within normal limits. The thoracic aorta is tortuous. No acute osseous pathology. IMPRESSION: Focal left infrahilar atelectasis versus developing pneumonia. Clinical correlation and follow-up  recommended. Electronically Signed   By: Elgie Collard M.D.   On: 07/12/2015 22:32     IMPRESSION AND PLAN:   * Left community-acquired pneumonia Afebrile and not needing oxygen. Has mildly elevated WBC of 12,000. Patient does have significant weakness, loss of appetite and dizziness. Seems dehydrated. Start IV Levaquin. Cultures have been sent from the emergency room. Start IV fluids.  * Dehydration due to poor oral intake encouraged more fluid intake.  * Mild hypokalemia will be replaced through IV fluids.  * Weakness Consult physical therapy  * DVT prophylaxis with Lovenox    All the records are reviewed and case discussed with ED provider. Management plans discussed with the patient, family and they are in agreement.  CODE STATUS: FULL  TOTAL TIME TAKING CARE OF THIS PATIENT: 40 minutes.    Milagros Loll R M.D on 07/13/2015 at 12:51 AM  Between 7am to 6pm - Pager - [phone]  After 6pm go to [email or web address] - password [email or web address]  [facility]  Office  [phone]  CC: Primary care physician; Rafael Bihari, MD     Note: This dictation was prepared with Dragon dictation along with smaller phrase technology. Any transcriptional errors that result from this process are unintentional.

## 2015-07-13 NOTE — Care Management Obs Status (Signed)
MEDICARE OBSERVATION STATUS NOTIFICATION   Patient Details  Name: Edwin Sutton MRN: [id number] Date of Birth: 09-03-1928   Medicare Observation Status Notification Given:  Yes    [name], RN 07/13/2015, 7:37 AM

## 2015-07-14 ENCOUNTER — Other Ambulatory Visit: Payer: Self-pay | Admitting: *Deleted

## 2015-07-14 MED ORDER — POTASSIUM CHLORIDE 20 MEQ/15ML (10%) PO SOLN
40.0000 meq | Freq: Once | ORAL | Status: DC
  Filled 2015-07-14: qty 30

## 2015-07-14 MED ORDER — MIRTAZAPINE 15 MG PO TABS: 7.5000 mg | ORAL_TABLET | Freq: Every day | ORAL | Status: DC

## 2015-07-14 MED ORDER — ENSURE ENLIVE PO LIQD: 237.0000 mL | Freq: Two times a day (BID) | ORAL | Status: DC

## 2015-07-14 MED ORDER — TAMSULOSIN HCL 0.4 MG PO CAPS: 0.4000 mg | ORAL_CAPSULE | Freq: Every day | ORAL | Status: DC

## 2015-07-14 MED ORDER — LEVOFLOXACIN 750 MG PO TABS: 750.0000 mg | ORAL_TABLET | Freq: Every day | ORAL | Status: DC

## 2015-07-14 NOTE — Care Management (Signed)
Patient discharging back to home today. I have notified Darlene daughter and she will arrange for her brother to pick patient up within next couple of hours. I have notified Lonn Georgia RN with [facility] of patient discharge. No further RNCM needs. Case closed.

## 2015-07-14 NOTE — Discharge Summary (Signed)
[facility] - [place] at [facility]   PATIENT NAME: Edwin Sutton    MR#:  [id number]  DATE OF BIRTH:  1929/03/21  DATE OF ADMISSION:  07/12/2015 ADMITTING PHYSICIAN: Milagros Loll, MD  DATE OF DISCHARGE: 07/14/2015  PRIMARY CARE PHYSICIAN: Rafael Bihari, MD    ADMISSION DIAGNOSIS:  Community acquired pneumonia [J18.9]  DISCHARGE DIAGNOSIS:  Active Problems:   Pneumonia   SECONDARY DIAGNOSIS:   Past Medical History  Diagnosis Date  . Hypertension   . Arthritis   . HLD (hyperlipidemia)   . Renal failure     HOSPITAL COURSE:    82 year old male with essential hypertension off medications since last hospitalization due to low blood pressure and hyperlipidemia who presents with weakness and loss of appetite.  1.Left lower lobe community-acquired pneumonia:Patient was not hypoxic nor was he febrile. Chest x-ray was concerning for pneumonia and therefore he was started on Levaquin. The cultures remain negative.  2. Generalized weakness with dehydration: Physical therapy consult  is recommending home with home health care.  3. Hypokalemia: Potassium was repleted. 4.History of hypertension: His blood pressure medications were discontinued due to low blood pressure during last hospital stay. Blood pressure is low normal. I would not restart medications. Outpatient follow up with his primary care physician.   DISCHARGE CONDITIONS AND DIET:    stable for discharge on a regular diet  CONSULTS OBTAINED:     DRUG ALLERGIES:  No Known Allergies  DISCHARGE MEDICATIONS:   Current Discharge Medication List    START taking these medications   Details  !! feeding supplement, ENSURE ENLIVE, (ENSURE ENLIVE) LIQD Take 237 mLs by mouth 2 (two) times daily between meals. Qty: 237 mL, Refills: 12    levofloxacin (LEVAQUIN) 750 MG tablet Take 1 tablet (750 mg total) by mouth daily. Qty: 5 tablet, Refills: 0     !! - Potential duplicate medications  found. Please discuss with provider.    CONTINUE these medications which have NOT CHANGED   Details  acetaminophen (TYLENOL) 500 MG tablet Take 500-1,000 mg by mouth every 6 (six) hours as needed for mild pain or headache. Reported on 07/04/2015    !! feeding supplement, ENSURE ENLIVE, (ENSURE ENLIVE) LIQD Take 237 mLs by mouth 3 (three) times daily with meals. Qty: 237 mL, Refills: 12    mirtazapine (REMERON) 7.5 MG tablet Take 7.5 mg by mouth at bedtime.    tamsulosin (FLOMAX) 0.4 MG CAPS capsule Take 1 capsule (0.4 mg total) by mouth at bedtime. Qty: 90 capsule, Refills: 3   Associated Diagnoses: Urinary retention     !! - Potential duplicate medications found. Please discuss with provider.    STOP taking these medications     cephALEXin (KEFLEX) 500 MG capsule               Today   CHIEF COMPLAINT:   No acute events overnight. Patient is reporting shortness of breath or chest pain.   VITAL SIGNS:  Blood pressure 101/64, pulse 87, temperature 98.3 F (36.8 C), temperature source Oral, resp. rate 18, height 6' (1.829 m), weight 83.643 kg (184 lb 6.4 oz), SpO2 99 %.   REVIEW OF SYSTEMS:  Review of Systems  Constitutional: Negative for fever, chills and malaise/fatigue.  HENT: Negative for sore throat.   Eyes: Negative for blurred vision.  Respiratory: Negative for cough, hemoptysis, shortness of breath and wheezing.   Cardiovascular: Negative for chest pain, palpitations and leg swelling.  Gastrointestinal: Negative for nausea, vomiting,  abdominal pain, diarrhea and blood in stool.  Genitourinary: Negative for dysuria.  Musculoskeletal: Negative for back pain.  Neurological: Negative for dizziness, tremors and headaches.  Endo/Heme/Allergies: Does not bruise/bleed easily.     PHYSICAL EXAMINATION:  GENERAL:  82 y.o.-year-old patient lying in the bed with no acute distress.  NECK:  Supple, no jugular venous distention. No thyroid enlargement, no tenderness.   LUNGS: Normal breath sounds bilaterally, no wheezing, rales,rhonchi  No use of accessory muscles of respiration.  CARDIOVASCULAR: S1, S2 normal. No murmurs, rubs, or gallops.  ABDOMEN: Soft, non-tender, non-distended. Bowel sounds present. No organomegaly or mass.  EXTREMITIES: No pedal edema, cyanosis, or clubbing.  PSYCHIATRIC: The patient is alert and oriented x name SKIN: No obvious rash, lesion, or ulcer.   DATA REVIEW:   CBC  Recent Labs Lab 07/13/15 0551  WBC 10.3  HGB 9.0*  HCT 26.5*  PLT 139*    Chemistries   Recent Labs Lab 07/13/15 0551  NA 144  K 3.2*  CL 110  CO2 28  GLUCOSE 106*  BUN 28*  CREATININE 0.84  CALCIUM 8.5*    Cardiac Enzymes No results for input(s): TROPONINI in the last 168 hours.  Microbiology Results  @  RADIOLOGY:  Dg Chest 2 View  07/12/2015  CLINICAL DATA:  82 year old male with shortness of breath EXAM: CHEST  2 VIEW COMPARISON:  Radiograph dated 06/19/2015 FINDINGS: Two views of the chest demonstrate a focal area of increased density in the left infrahilar region which may represent atelectatic changes. Developing pneumonia is not excluded. Left lung base linear and platelike atelectatic changes noted. The right lung is clear. No pleural effusion or pneumothorax. The cardiac silhouette is within normal limits. The thoracic aorta is tortuous. No acute osseous pathology. IMPRESSION: Focal left infrahilar atelectasis versus developing pneumonia. Clinical correlation and follow-up recommended. Electronically Signed   By: Elgie Collard M.D.   On: 07/12/2015 22:32      Management plans discussed with the patient and he is in agreement. Stable for discharge home with [facility]  Patient should follow up with PCP in 1 week  CODE STATUS:     Code Status Orders        Start     Ordered   07/13/15 0050  Full code   Continuous     07/13/15 0050    Code Status History    Date Active Date Inactive Code Status Order ID  Comments User Context   06/19/2015  5:00 PM 06/26/2015  4:26 PM Full Code [id number]  Katha Hamming, MD ED      TOTAL TIME TAKING CARE OF THIS PATIENT: 35 minutes.    Note: This dictation was prepared with Dragon dictation along with smaller phrase technology. Any transcriptional errors that result from this process are unintentional.  Ziad Maye M.D on 07/14/2015 at 10:48 AM  Between 7am to 6pm - Pager - [phone] After 6pm go to [email or web address] - password [email or web address] [facility]  [place] [facility]  Office  [phone]  CC: Primary care physician; Rafael Bihari, MD

## 2015-07-14 NOTE — Consult Note (Addendum)
   [facility] Inpatient Consult   07/14/2015  Tahjir Silveria 1928/12/01 [id number]   Referral has been received from patient's insurance for [facility] Care Management services. Notified of admission and request for follow up with son Doris Mcgilvery to explain and offer [facility] Care Management services. Telephone call made to Lehi Phifer at [phone] to explain [facility] Care Management program. He is agreeable and verbal consent obtained. He reports patient lives with him and he has had [facility] prior to admission. Explained to [name] (son) that [facility] will not interfere or replace services provided by home health. [name] states he will be primary contact at [phone].Will request for patient to be assigned to community [facility] and will make inpatient RNCM aware.   Raiford Noble, MSN-Ed, RN,BSN [facility] Liaison [phone]

## 2015-07-17 ENCOUNTER — Other Ambulatory Visit: Payer: Self-pay | Admitting: *Deleted

## 2015-07-17 ENCOUNTER — Encounter: Payer: Self-pay | Admitting: *Deleted

## 2015-07-17 NOTE — Patient Outreach (Signed)
Transition of care call (week 1, discharged 2/10): Spoke with son (main contact), HIPPA verified.  Son reports pt is doing better today.  Son reports pt's appetite down, staying hydrated with water and ensure (three times a day).  Son reports HH PT came today.  Son reports pt is to f/u with Dr. Dan Humphreys 2/16.    RN CM discussed with son [facility] services, following pt for transition of care (31 days post discharge- weekly phone calls, home visit).  As discussed with son, plan to do home visit 2/21.       Shayne Alken.   Fin Hupp RN CCM [facility] Care Management  [phone]

## 2015-07-18 LAB — CULTURE, BLOOD (ROUTINE X 2)
CULTURE: NO GROWTH
CULTURE: NO GROWTH

## 2015-07-25 ENCOUNTER — Other Ambulatory Visit: Payer: Self-pay | Admitting: *Deleted

## 2015-07-25 ENCOUNTER — Encounter: Payer: Self-pay | Admitting: *Deleted

## 2015-07-26 NOTE — Patient Outreach (Signed)
[facility]) Care Management   07/26/2015  For home visit done 07/25/15   Edwin Sutton [birth year] [id number]  Edwin Sutton is an 82 y.o. male  Subjective:  Son reports pt eats very little, likes Ensure- has three cans a day.  Son reports pt sleeps all day, weak. Son reports HH PT, RN coming.  Pt reports weights range 191-192 lbs.  Son reports pt to f/u with Dr. Dan Humphreys 2/28.  Objective:  Filed Vitals:   07/25/15 1326 07/25/15 1420  BP: 80/50 94/62  Pulse: 89   Resp: 20      ROS  Physical Exam  Constitutional: He is oriented to person, place, and time.  Frail looking   Cardiovascular: Normal rate and regular rhythm.   Respiratory: Effort normal and breath sounds normal.  GI: Soft.  Neurological: He is alert and oriented to person, place, and time.  Skin: Skin is warm and dry.  Psychiatric: He has a normal mood and affect. His behavior is normal. Judgment and thought content normal.    Current Medications:  Reviewed with son  Current Outpatient Prescriptions  Medication Sig Dispense Refill  . feeding supplement, ENSURE ENLIVE, (ENSURE ENLIVE) LIQD Take 237 mLs by mouth 3 (three) times daily with meals. 237 mL 12  . mirtazapine (REMERON) 7.5 MG tablet Take 7.5 mg by mouth at bedtime.    . naproxen sodium (ANAPROX) 220 MG tablet Take 220 mg by mouth as needed. Pt takes every other day    . tamsulosin (FLOMAX) 0.4 MG CAPS capsule Take 1 capsule (0.4 mg total) by mouth at bedtime. 90 capsule 3  . acetaminophen (TYLENOL) 500 MG tablet Take 500-1,000 mg by mouth every 6 (six) hours as needed for mild pain or headache. Reported on 07/25/2015    . feeding supplement, ENSURE ENLIVE, (ENSURE ENLIVE) LIQD Take 237 mLs by mouth 2 (two) times daily between meals. (Patient not taking: Reported on 07/17/2015) 237 mL 12  . levofloxacin (LEVAQUIN) 750 MG tablet Take 1 tablet (750 mg total) by mouth daily. (Patient not taking: Reported on 07/25/2015) 5 tablet 0   No current  facility-administered medications for this visit.    Functional Status:   In your present state of health, do you have any difficulty performing the following activities: 07/25/2015 07/13/2015  Hearing? N N  Vision? N N  Difficulty concentrating or making decisions? N N  Walking or climbing stairs? Y Y  Dressing or bathing? Y Y  Doing errands, shopping? Edwin Sutton  Preparing Food and eating ? Y -  Using the Toilet? N -  In the past six months, have you accidently leaked urine? N -  Do you have problems with loss of bowel control? N -  Managing your Medications? Y -  Managing your Finances? Y -  Housekeeping or managing your Housekeeping? Y -    Fall/Depression Screening:    PHQ 2/9 Scores 07/25/2015  PHQ - 2 Score 0    Assessment:   Upon arrival, pt was lying down in bed.  With assistance from son, was able to walk to table with his walker.    Low BP -  80/50, had pt drink approximately 12 oz of water, recheck 94/62.                   Impaired nutrition-  Son reports pt eats very little, takes 3 cans of ensure daily, very little protein.  Plan:  As discussed, son to use blender (need to purchase) to mix protein (yogurt, chicken) in Ensure.             Low BP:  Son to make sure pt is hydrated.           Pt to f/u with Dr. Dan Humphreys 2/28.           RN CM to continue to follow pt for transition of care- next contact telephonically 2/28.           Plan to route encounter in Epic to Dr. Dan Humphreys   [facility] CM Care Plan Problem One        Most Recent Value   Care Plan Problem One  Risk for readmission related to recent hospitalization for PNA    Role Documenting the Problem One  Care Management Coordinator   Care Plan for Problem One  Active   [facility] Long Term Goal (31-90 days)  Pt would not readmit 31 days from day of discharge.     [facility] Long Term Goal Start Date  07/17/15   Interventions for Problem One Long Term Goal  As discussed with son, plan to follow for transition of care-  weekly phone calls, home visit scheduled for 2/21.    [facility] CM Short Term Goal #1 (0-30 days)  Pt's appetite would improve in the next 14 days    [facility] CM Short Term Goal #1 Start Date  07/25/15   Interventions for Short Term Goal #1  discussed with son use of blender to add protein (ex. yogurt, chicken)to Ensure    [facility] CM Short Term Goal #2 (0-30 days)  Pt's PNA would be resolved in the next 10- 14 days    [facility] CM Short Term Goal #2 Start Date  07/17/15   Interventions for Short Term Goal #2  Discussed with pt's son ongoing compliance with antibiotic, importiance of hydration       Edwin Hefferan M.   Hydee Fleece RN CCM [facility] Care Management  [phone]

## 2015-08-01 ENCOUNTER — Ambulatory Visit: Payer: Self-pay | Admitting: *Deleted

## 2015-08-01 ENCOUNTER — Ambulatory Visit: Payer: Commercial Managed Care - HMO | Admitting: Urology

## 2015-08-01 ENCOUNTER — Encounter: Payer: Self-pay | Admitting: Emergency Medicine

## 2015-08-01 ENCOUNTER — Emergency Department: Payer: Commercial Managed Care - HMO

## 2015-08-01 ENCOUNTER — Inpatient Hospital Stay
Admission: EM | Admit: 2015-08-01 | Discharge: 2015-08-04 | DRG: 193 | Disposition: A | Discharge status: Skilled Nursing Facility | Payer: Commercial Managed Care - HMO | Attending: Internal Medicine | Admitting: Internal Medicine

## 2015-08-01 DIAGNOSIS — J189 Pneumonia, unspecified organism: Principal | ICD-10-CM | POA: Diagnosis present

## 2015-08-01 DIAGNOSIS — Z79899 Other long term (current) drug therapy: Secondary | ICD-10-CM

## 2015-08-01 DIAGNOSIS — R627 Adult failure to thrive: Secondary | ICD-10-CM | POA: Diagnosis present

## 2015-08-01 DIAGNOSIS — G9341 Metabolic encephalopathy: Secondary | ICD-10-CM | POA: Diagnosis present

## 2015-08-01 DIAGNOSIS — Y95 Nosocomial condition: Secondary | ICD-10-CM | POA: Diagnosis present

## 2015-08-01 DIAGNOSIS — M25512 Pain in left shoulder: Secondary | ICD-10-CM | POA: Diagnosis present

## 2015-08-01 DIAGNOSIS — R531 Weakness: Secondary | ICD-10-CM | POA: Diagnosis not present

## 2015-08-01 DIAGNOSIS — R338 Other retention of urine: Secondary | ICD-10-CM | POA: Diagnosis present

## 2015-08-01 DIAGNOSIS — I248 Other forms of acute ischemic heart disease: Secondary | ICD-10-CM | POA: Diagnosis present

## 2015-08-01 DIAGNOSIS — I480 Paroxysmal atrial fibrillation: Secondary | ICD-10-CM | POA: Diagnosis present

## 2015-08-01 DIAGNOSIS — Z96643 Presence of artificial hip joint, bilateral: Secondary | ICD-10-CM | POA: Diagnosis present

## 2015-08-01 DIAGNOSIS — E876 Hypokalemia: Secondary | ICD-10-CM | POA: Diagnosis present

## 2015-08-01 DIAGNOSIS — Z87891 Personal history of nicotine dependence: Secondary | ICD-10-CM | POA: Diagnosis not present

## 2015-08-01 DIAGNOSIS — N401 Enlarged prostate with lower urinary tract symptoms: Secondary | ICD-10-CM | POA: Diagnosis present

## 2015-08-01 DIAGNOSIS — R410 Disorientation, unspecified: Secondary | ICD-10-CM | POA: Diagnosis present

## 2015-08-01 DIAGNOSIS — E785 Hyperlipidemia, unspecified: Secondary | ICD-10-CM | POA: Diagnosis present

## 2015-08-01 DIAGNOSIS — I10 Essential (primary) hypertension: Secondary | ICD-10-CM | POA: Diagnosis present

## 2015-08-01 DIAGNOSIS — R7989 Other specified abnormal findings of blood chemistry: Secondary | ICD-10-CM

## 2015-08-01 DIAGNOSIS — Z8701 Personal history of pneumonia (recurrent): Secondary | ICD-10-CM

## 2015-08-01 DIAGNOSIS — R740 Nonspecific elevation of levels of transaminase and lactic acid dehydrogenase [LDH]: Secondary | ICD-10-CM | POA: Diagnosis present

## 2015-08-01 DIAGNOSIS — M1990 Unspecified osteoarthritis, unspecified site: Secondary | ICD-10-CM | POA: Diagnosis present

## 2015-08-01 DIAGNOSIS — R778 Other specified abnormalities of plasma proteins: Secondary | ICD-10-CM

## 2015-08-01 DIAGNOSIS — N400 Enlarged prostate without lower urinary tract symptoms: Secondary | ICD-10-CM | POA: Diagnosis present

## 2015-08-01 DIAGNOSIS — E860 Dehydration: Secondary | ICD-10-CM | POA: Diagnosis present

## 2015-08-01 HISTORY — DX: Benign prostatic hyperplasia without lower urinary tract symptoms: N40.0

## 2015-08-01 LAB — CBC WITH DIFFERENTIAL/PLATELET
Basophils Absolute: 0 K/uL (ref 0–0.1)
Basophils Relative: 0 %
Eosinophils Absolute: 0 K/uL (ref 0–0.7)
Eosinophils Relative: 0 %
HCT: 31.3 % — ABNORMAL LOW (ref 40.0–52.0)
Hemoglobin: 10.4 g/dL — ABNORMAL LOW (ref 13.0–18.0)
Lymphocytes Relative: 7 %
Lymphs Abs: 0.7 K/uL — ABNORMAL LOW (ref 1.0–3.6)
MCH: 30.5 pg (ref 26.0–34.0)
MCHC: 33.2 g/dL (ref 32.0–36.0)
MCV: 91.9 fL (ref 80.0–100.0)
Monocytes Absolute: 0.5 K/uL (ref 0.2–1.0)
Monocytes Relative: 5 %
Neutro Abs: 8.8 K/uL — ABNORMAL HIGH (ref 1.4–6.5)
Neutrophils Relative %: 88 %
Platelets: 140 K/uL — ABNORMAL LOW (ref 150–440)
RBC: 3.41 MIL/uL — ABNORMAL LOW (ref 4.40–5.90)
RDW: 16.1 % — ABNORMAL HIGH (ref 11.5–14.5)
WBC: 10 K/uL (ref 3.8–10.6)

## 2015-08-01 LAB — COMPREHENSIVE METABOLIC PANEL
ALK PHOS: 66 U/L (ref 38–126)
ALT: 12 U/L — ABNORMAL LOW (ref 17–63)
ANION GAP: 13 (ref 5–15)
AST: 37 U/L (ref 15–41)
Albumin: 2.4 g/dL — ABNORMAL LOW (ref 3.5–5.0)
BILIRUBIN TOTAL: 1.4 mg/dL — AB (ref 0.3–1.2)
BUN: 11 mg/dL (ref 6–20)
CALCIUM: 8.8 mg/dL — AB (ref 8.9–10.3)
CO2: 26 mmol/L (ref 22–32)
Chloride: 103 mmol/L (ref 101–111)
Creatinine, Ser: 0.62 mg/dL (ref 0.61–1.24)
GFR calc non Af Amer: >60 mL/min (ref >60)
Glucose, Bld: 132 mg/dL — ABNORMAL HIGH (ref 65–99)
POTASSIUM: 3.1 mmol/L — AB (ref 3.5–5.1)
SODIUM: 142 mmol/L (ref 135–145)
TOTAL PROTEIN: 6.5 g/dL (ref 6.5–8.1)

## 2015-08-01 LAB — URINALYSIS COMPLETE WITH MICROSCOPIC (ARMC ONLY)
BILIRUBIN URINE: NEGATIVE
Bacteria, UA: NONE SEEN
GLUCOSE, UA: NEGATIVE mg/dL
Hgb urine dipstick: NEGATIVE
KETONES UR: NEGATIVE mg/dL
Leukocytes, UA: NEGATIVE
NITRITE: NEGATIVE
Protein, ur: NEGATIVE mg/dL
SPECIFIC GRAVITY, URINE: 1.017 (ref 1.005–1.030)
pH: 6 (ref 5.0–8.0)

## 2015-08-01 LAB — TROPONIN I: Troponin I: 0.06 ng/mL — ABNORMAL HIGH

## 2015-08-01 LAB — LIPASE, BLOOD: Lipase: 25 U/L (ref 11–51)

## 2015-08-01 LAB — LACTIC ACID, PLASMA
LACTIC ACID, VENOUS: 2.4 mmol/L — AB (ref 0.5–2.0)
Lactic Acid, Venous: 1.6 mmol/L (ref 0.5–2.0)

## 2015-08-01 MED ORDER — ENOXAPARIN SODIUM 40 MG/0.4ML ~~LOC~~ SOLN
40.0000 mg | Freq: Every day | SUBCUTANEOUS | Status: DC
  Administered 2015-08-02 (×2): 40 mg via SUBCUTANEOUS
  Filled 2015-08-01 (×3): qty 0.4

## 2015-08-01 MED ORDER — SODIUM CHLORIDE 0.9 % IV SOLN
Freq: Once | INTRAVENOUS | Status: AC
  Administered 2015-08-01: 12:00:00 via INTRAVENOUS

## 2015-08-01 MED ORDER — ACETAMINOPHEN 325 MG PO TABS: 650.0000 mg | ORAL_TABLET | Freq: Four times a day (QID) | ORAL | Status: DC | PRN

## 2015-08-01 MED ORDER — SODIUM CHLORIDE 0.9% FLUSH
3.0000 mL | Freq: Two times a day (BID) | INTRAVENOUS | Status: DC
  Administered 2015-08-02 – 2015-08-04 (×5): 3 mL via INTRAVENOUS

## 2015-08-01 MED ORDER — ACETAMINOPHEN 650 MG RE SUPP: 650.0000 mg | Freq: Four times a day (QID) | RECTAL | Status: DC | PRN

## 2015-08-01 MED ORDER — PIPERACILLIN-TAZOBACTAM 3.375 G IVPB
3.3750 g | Freq: Three times a day (TID) | INTRAVENOUS | Status: DC
  Administered 2015-08-02 – 2015-08-04 (×8): 3.375 g via INTRAVENOUS
  Filled 2015-08-01 (×10): qty 50

## 2015-08-01 MED ORDER — SODIUM CHLORIDE 0.9 % IV SOLN
Freq: Once | INTRAVENOUS | Status: AC
  Administered 2015-08-01: 17:00:00 via INTRAVENOUS

## 2015-08-01 MED ORDER — SODIUM CHLORIDE 0.9 % IV SOLN
INTRAVENOUS | Status: AC
  Administered 2015-08-02 (×2): via INTRAVENOUS

## 2015-08-01 MED ORDER — PIPERACILLIN-TAZOBACTAM 3.375 G IVPB 30 MIN
3.3750 g | Freq: Once | INTRAVENOUS | Status: AC
  Administered 2015-08-01: 3.375 g via INTRAVENOUS
  Filled 2015-08-01: qty 50

## 2015-08-01 MED ORDER — ONDANSETRON HCL 4 MG PO TABS: 4.0000 mg | ORAL_TABLET | Freq: Four times a day (QID) | ORAL | Status: DC | PRN

## 2015-08-01 MED ORDER — VANCOMYCIN HCL 10 G IV SOLR
1250.0000 mg | Freq: Once | INTRAVENOUS | Status: AC
  Administered 2015-08-01: 1250 mg via INTRAVENOUS
  Filled 2015-08-01: qty 1250

## 2015-08-01 MED ORDER — ASPIRIN 81 MG PO CHEW
324.0000 mg | CHEWABLE_TABLET | Freq: Once | ORAL | Status: AC
  Administered 2015-08-01: 324 mg via ORAL
  Filled 2015-08-01: qty 4

## 2015-08-01 MED ORDER — ONDANSETRON HCL 4 MG/2ML IJ SOLN: 4.0000 mg | Freq: Four times a day (QID) | INTRAMUSCULAR | Status: DC | PRN

## 2015-08-01 MED ORDER — POTASSIUM CHLORIDE 10 MEQ/100ML IV SOLN
10.0000 meq | INTRAVENOUS | Status: AC
  Administered 2015-08-02 (×4): 10 meq via INTRAVENOUS
  Filled 2015-08-01 (×4): qty 100

## 2015-08-01 MED ORDER — VANCOMYCIN HCL IN DEXTROSE 1-5 GM/200ML-% IV SOLN
1000.0000 mg | Freq: Two times a day (BID) | INTRAVENOUS | Status: DC
  Administered 2015-08-02 – 2015-08-03 (×3): 1000 mg via INTRAVENOUS
  Filled 2015-08-01 (×5): qty 200

## 2015-08-01 NOTE — ED Notes (Signed)
Patient presents to the ED via [facility] for weakness.  Patient's son was attempting to take patient to a doctor's appointment that was made because patient has not been eating or drinking well.  As patient walked outside he became very weak and was unable to stand or walk.  Patient's son called EMS at that time.  Per EMS patient did not fall but was assisted to the ground by his son.  Patient is alert and answers questions appropriately but is somnolent, falling asleep after a very short period.  Patient is complaining of pain in his left shoulder.  Patient's mucous membranes appear dry and his skin turgor is poor.  Patient also has a dry cough. Patient is afebrile at this time.

## 2015-08-01 NOTE — ED Provider Notes (Addendum)
[facility] Emergency Department Provider Note  ____________________________________________  Time seen: Approximately 11:53 AM  I have reviewed the triage vital signs and the nursing notes.   HISTORY  Chief Complaint Failure To Thrive and Weakness  Chief complaint is weakness  HPI Monico Sudduth is a 82 y.o. male who comes from home because he has not been eating or drinking much. He complains of pain in his left shoulder. He also has been getting weak the last day or so. He was here the beginning of the month for the same sort of problems. He does seem to be moderately to severely weak and has difficulty sitting up by himself. Nothing seems to make it better or worse. Besides the shoulder pain he has no other associated symptoms including coughing nausea vomiting diarrhea or chest pain.   Past Medical History  Diagnosis Date  . Hypertension   . Arthritis   . HLD (hyperlipidemia)   . Renal failure     Patient Active Problem List   Diagnosis Date Noted  . Pneumonia 07/13/2015  . Urinary retention 07/05/2015  . Acute delirium 06/23/2015  . Pressure ulcer 06/20/2015  . Acute renal failure (ARF) (HCC) 06/19/2015  . Breathlessness on exertion 06/01/2015  . Benign essential HTN 05/24/2015  . Combined fat and carbohydrate induced hyperlipemia 05/17/2015  . Paroxysmal atrial fibrillation (HCC) 05/17/2015  . Thrombocytopenia (HCC) 01/16/2015  . Cerebrovascular accident (CVA) (HCC) 08/29/2014    Past Surgical History  Procedure Laterality Date  . Total hip arthroplasty Bilateral     Current Outpatient Rx  Name  Route  Sig  Dispense  Refill  . mirtazapine (REMERON) 7.5 MG tablet   Oral   Take 7.5 mg by mouth at bedtime.         Marland Kitchen acetaminophen (TYLENOL) 500 MG tablet   Oral   Take 500-1,000 mg by mouth every 6 (six) hours as needed for mild pain or headache. Reported on 07/25/2015         . feeding supplement, ENSURE ENLIVE, (ENSURE ENLIVE)  LIQD   Oral   Take 237 mLs by mouth 3 (three) times daily with meals.   237 mL   12   . feeding supplement, ENSURE ENLIVE, (ENSURE ENLIVE) LIQD   Oral   Take 237 mLs by mouth 2 (two) times daily between meals. Patient not taking: Reported on 07/17/2015   237 mL   12   . levofloxacin (LEVAQUIN) 750 MG tablet   Oral   Take 1 tablet (750 mg total) by mouth daily. Patient not taking: Reported on 07/25/2015   5 tablet   0   . naproxen sodium (ANAPROX) 220 MG tablet   Oral   Take 220 mg by mouth as needed. Pt takes every other day         . tamsulosin (FLOMAX) 0.4 MG CAPS capsule   Oral   Take 1 capsule (0.4 mg total) by mouth at bedtime.   90 capsule   3     Allergies Review of patient's allergies indicates no known allergies.  Family History  Problem Relation Age of Onset  . Kidney disease Neg Hx   . Prostate cancer Neg Hx     Social History Social History  Substance Use Topics  . Smoking status: Former Games developer  . Smokeless tobacco: None  . Alcohol Use: No    Review of Systems Constitutional: No fever/chills Eyes: No visual changes. ENT: No sore throat. Cardiovascular: Denies chest pain. Respiratory: Denies shortness  of breath. Gastrointestinal: No abdominal pain.  No nausea, no vomiting.  No diarrhea.  No constipation. Genitourinary: Negative for dysuria. Musculoskeletal: Negative for back pain. Skin: Negative for rash. Neurological: Negative for headaches, focal weakness or numbness.  10-point ROS otherwise negative.  ____________________________________________   PHYSICAL EXAM:  VITAL SIGNS: ED Triage Vitals  Enc Vitals Group     BP --      Pulse --      Resp --      Temp --      Temp src --      SpO2 --      Weight --      Height --      Head Cir --      Peak Flow --      Pain Score --      Pain Loc --      Pain Edu? --      Excl. in GC? --     Constitutional: Alert and oriented. Skin he gentleman who does not appear to be in acute  distress Eyes: Conjunctivae are normal. PERRL. EOMI. Head: Atraumatic. Nose: No congestion/rhinnorhea. Mouth/Throat: Mucous membranes are moist.  Oropharynx non-erythematous. Neck: No stridor.  { Cardiovascular: Normal rate, regular rhythm. Grossly normal heart sounds.  Good peripheral circulation. Respiratory: Normal respiratory effort.  No retractions. Lungs CTAB. Gastrointestinal: Soft and nontender. No distention. No abdominal bruits. No CVA tenderness. Musculoskeletal: No lower extremity tenderness nor edema.  No joint effusions. Left shoulders tender to palpation. Patient reports a lot of pain on trying to move it. Fact he will not move it for me. Neurologic:  Normal speech and language. No gross focal neurologic deficits are appreciated. No gait instability. Skin:  Skin is warm, dry and intact. No rash noted. Psychiatric: Mood and affect are normal. Speech and behavior are normal.  ____________________________________________   LABS (all labs ordered are listed, but only abnormal results are displayed)  Labs Reviewed  COMPREHENSIVE METABOLIC PANEL - Abnormal; Notable for the following:    Potassium 3.1 (*)    Glucose, Bld 132 (*)    Calcium 8.8 (*)    Albumin 2.4 (*)    ALT 12 (*)    Total Bilirubin 1.4 (*)    All other components within normal limits  TROPONIN I - Abnormal; Notable for the following:    Troponin I 0.06 (*)    All other components within normal limits  LACTIC ACID, PLASMA - Abnormal; Notable for the following:    Lactic Acid, Venous 2.4 (*)    All other components within normal limits  CBC WITH DIFFERENTIAL/PLATELET - Abnormal; Notable for the following:    RBC 3.41 (*)    Hemoglobin 10.4 (*)    HCT 31.3 (*)    RDW 16.1 (*)    Platelets 140 (*)    Neutro Abs 8.8 (*)    Lymphs Abs 0.7 (*)    All other components within normal limits  LIPASE, BLOOD  LACTIC ACID, PLASMA  URINALYSIS COMPLETEWITH MICROSCOPIC ([facility] ONLY)    ____________________________________________  EKG  EKG read and interpreted by me shows normal sinus rhythm rate ofp normal axis no acute ST-T wave changes ____________________________________________  RADIOLOGY  Dictated for the third time radiologist beats pneumonia   PROCEDURES ____________________________________________   INITIAL IMPRESSION / ASSESSMENT AND PLAN / ED COURSE  Pertinent labs & imaging results that were available during my care of the patient were reviewed by me and considered in my medical decision making (see  chart for details).   ____________________________________________   FINAL CLINICAL IMPRESSION(S) / ED DIAGNOSES  Final diagnoses:  Weakness  Elevated troponin  Elevated lactic acid level  Community acquired pneumonia      Arnaldo Natal, MD 08/01/15 1729  CAT scan read as no acute disease this is the third time I dictated this to  Arnaldo Natal, MD 08/01/15 1730 Patient signed out to Dr. Enis Gash, MD 08/01/15 1745

## 2015-08-01 NOTE — ED Notes (Signed)
Patient transported to room 118

## 2015-08-01 NOTE — Progress Notes (Signed)
Pharmacy Antibiotic Note  Edwin Sutton is a 82 y.o. male admitted on 08/01/2015 with pneumonia.  Pharmacy has been consulted for Zosyn and vancomycin dosing.  Plan: 1. Zosyn 3.375 gm IV Q8H EI 2. Vancomycin 1.25 gm IV x 1 in ED followed by 1 gm IV Q12H with stacked dosing, second dose approximately 8 hours after first, predicted trough 17 mcg/mL. Pharmacy will continue to follow and adjust as needed to maintain trough 15 to 20 mcg/mL.  Height: 6' (182.9 cm) Weight: 163 lb 4.8 oz (74.072 kg) IBW/kg (Calculated) : 77.6  Temp (24hrs), Avg:98 F (36.7 C), Min:97.9 F (36.6 C), Max:98 F (36.7 C)   Recent Labs Lab 08/01/15 1158 08/01/15 1511  WBC 10.0  --   CREATININE 0.62  --   LATICACIDVEN 2.4* 1.6    Estimated Creatinine Clearance: 72 mL/min (by C-G formula based on Cr of 0.62).    No Known Allergies   Thank you for allowing pharmacy to be a part of this patient's care.  Carola Frost, Pharm.D., BCPS Clinical Pharmacist 08/01/2015 11:57 PM

## 2015-08-01 NOTE — H&P (Addendum)
[facility] [place] [facility]   PATIENT NAME: Edwin Sutton    MR#:  [id number]  DATE OF BIRTH:  May 26, 1929  DATE OF ADMISSION:  08/01/2015  PRIMARY CARE PHYSICIAN: Rafael Bihari, MD   REQUESTING/REFERRING PHYSICIAN: Sharma Covert, MD  CHIEF COMPLAINT:   Chief Complaint  Patient presents with  . Failure To Thrive  . Weakness    HISTORY OF PRESENT ILLNESS:  Edwin Sutton  is a 82 y.o. male who presents with weakness and encephalopathy. Patient was admitted here in the hospital about a month ago and was treated for pneumonia. Since that time he has had a steady decline again at the facility where he stays. He is been more lethargic the last week or so, with reduced appetite and over the last couple of days increased confusion. He is brought back into the ED for evaluation and found to have recurrent or persistent left-sided pneumonia. Hospitalists were called for admission.  PAST MEDICAL HISTORY:   Past Medical History  Diagnosis Date  . Hypertension   . Arthritis   . HLD (hyperlipidemia)   . Renal failure   . BPH (benign prostatic hyperplasia)     PAST SURGICAL HISTORY:   Past Surgical History  Procedure Laterality Date  . Total hip arthroplasty Bilateral     SOCIAL HISTORY:   Social History  Substance Use Topics  . Smoking status: Former Games developer  . Smokeless tobacco: Not on file  . Alcohol Use: No    FAMILY HISTORY:   Family History  Problem Relation Age of Onset  . Kidney disease Neg Hx   . Prostate cancer Neg Hx     DRUG ALLERGIES:  No Known Allergies  MEDICATIONS AT HOME:   Prior to Admission medications   Medication Sig Start Date End Date Taking? Authorizing Provider  mirtazapine (REMERON) 7.5 MG tablet Take 7.5 mg by mouth at bedtime.   Yes Historical Provider, MD  acetaminophen (TYLENOL) 500 MG tablet Take 500-1,000 mg by mouth every 6 (six) hours as needed for mild pain or headache. Reported on 07/25/2015     Historical Provider, MD  feeding supplement, ENSURE ENLIVE, (ENSURE ENLIVE) LIQD Take 237 mLs by mouth 3 (three) times daily with meals. 06/26/15   Alford Highland, MD  feeding supplement, ENSURE ENLIVE, (ENSURE ENLIVE) LIQD Take 237 mLs by mouth 2 (two) times daily between meals. Patient not taking: Reported on 07/17/2015 07/14/15   Adrian Saran, MD  levofloxacin (LEVAQUIN) 750 MG tablet Take 1 tablet (750 mg total) by mouth daily. Patient not taking: Reported on 07/25/2015 07/14/15   Adrian Saran, MD  naproxen sodium (ANAPROX) 220 MG tablet Take 220 mg by mouth as needed. Pt takes every other day    Historical Provider, MD  tamsulosin (FLOMAX) 0.4 MG CAPS capsule Take 1 capsule (0.4 mg total) by mouth at bedtime. 07/04/15   Harle Battiest, PA-C    REVIEW OF SYSTEMS:  Review of Systems  Unable to perform ROS: acuity of condition     VITAL SIGNS:   Filed Vitals:   08/01/15 1643 08/01/15 1644 08/01/15 1830 08/01/15 2100  BP:  111/70 102/60 96/65  Pulse: 88  78 72  Temp:      TempSrc:      Resp: Height:      Weight:      SpO2: 100%  95% 96%   Wt Readings from Last 3 Encounters:  08/01/15 73.891 kg (162 lb 14.4 oz)  07/25/15 87.091 kg (192 lb)  07/14/15 83.643 kg (184 lb 6.4 oz)    PHYSICAL EXAMINATION:  Physical Exam  Vitals reviewed. Constitutional: He appears well-developed. No distress.  Cachectic  HENT:  Head: Normocephalic and atraumatic.  Dry mucous membranes  Eyes: Conjunctivae and EOM are normal. Pupils are equal, round, and reactive to light. No scleral icterus.  Neck: Normal range of motion. Neck supple. No JVD present. No thyromegaly present.  Cardiovascular: Normal rate, regular rhythm and intact distal pulses.  Exam reveals no gallop and no friction rub.   No murmur heard. Respiratory: Effort normal. No respiratory distress. He has no wheezes. He has no rales.  Course left lower lobe sounds  GI: Soft. Bowel sounds are normal. He exhibits no distension.  There is no tenderness.  Musculoskeletal: Normal range of motion. He exhibits no edema.  No arthritis, no gout  Lymphadenopathy:    He has no cervical adenopathy.  Neurological: No cranial nerve deficit.  Arousable to verbal stimuli, though he remains lethargic, and he is not oriented.   Skin: Skin is warm and dry. No rash noted. No erythema.  Psychiatric:  Unable to assess due to dementia    LABORATORY PANEL:   CBC  Recent Labs Lab 08/01/15 1158  WBC 10.0  HGB 10.4*  HCT 31.3*  PLT 140*   ------------------------------------------------------------------------------------------------------------------  Chemistries   Recent Labs Lab 08/01/15 1158  NA 142  K 3.1*  CL 103  CO2 26  GLUCOSE 132*  BUN 11  CREATININE 0.62  CALCIUM 8.8*  AST 37  ALT 12*  ALKPHOS 66  BILITOT 1.4*   ------------------------------------------------------------------------------------------------------------------  Cardiac Enzymes  Recent Labs Lab 08/01/15 1158  TROPONINI 0.06*   ------------------------------------------------------------------------------------------------------------------  RADIOLOGY:  Dg Chest Portable 1 View  08/01/2015  CLINICAL DATA:  Weakness EXAM: PORTABLE CHEST 1 VIEW COMPARISON:  07/12/2015 FINDINGS: Low lung volumes. Left mid and lower lung zone consolidation. Normal heart size. No pneumothorax. IMPRESSION: Left lower lobe consolidation. Followup PA and lateral chest X-ray is recommended in 3-4 weeks following trial of antibiotic therapy to ensure resolution and exclude underlying malignancy. Electronically Signed   By: Jolaine Click M.D.   On: 08/01/2015 16:53   Dg Shoulder Left  08/01/2015  CLINICAL DATA:  82 year old male complaining of weakness today. History of trauma from a fall this morning complaining of left-sided shoulder pain. EXAM: LEFT SHOULDER - 2+ VIEW COMPARISON:  No priors. FINDINGS: No acute displaced fracture. Mild degenerative changes of  osteoarthritis including joint space narrowing, subchondral sclerosis and osteophyte formation are noted in the glenohumeral and acromioclavicular joints. Left shoulder is located. IMPRESSION: 1. No acute radiographic abnormality of the left shoulder. 2. Mild osteoarthritis in the glenohumeral and acromioclavicular joints. Electronically Signed   By: Trudie Reed M.D.   On: 08/01/2015 13:04    EKG:   Orders placed or performed during the hospital encounter of 08/01/15  . ED EKG  . ED EKG    IMPRESSION AND PLAN:  Principal Problem:   HCAP (healthcare-associated pneumonia) - antibiotics started in the ED, continues on admission. Lactic acid was elevated initially, but corrected with fluid administration. Patient is hemodynamically stable. Blood and sputum cultures ordered. Active Problems:   Acute delirium - likely due to his pneumonia and potentially his dehydration and poor by mouth intake. We'll provide fluids for hydration upfront, and encourage by mouth intake. Monitor for improvement as medical conditions improved.   Benign essential HTN - currently low and normotensive. Patient is also not on  any antihypertensives typically the outpatient setting. Likely due to his recent poor by mouth intake his blood pressure has not required antihypertensives. If his blood pressure rises we will initiate treatment.   Paroxysmal atrial fibrillation (HCC) - currently not on any rate controlling medications. He is nothing by mouth for now due to his altered mental status. Will monitor on telemetry and initiated treatment as needed.   BPH (benign prostatic hyperplasia) - patient has a some point been on Flomax, though he is nothing by mouth at this time. We'll monitor his intake and output carefully, and treat as necessary.  All the records are reviewed and case discussed with ED provider. Management plans discussed with the patient and/or family.  DVT PROPHYLAXIS: SubQ lovenox  GI PROPHYLAXIS:  None  ADMISSION STATUS: Inpatient  CODE STATUS: Full, based on chart review of prior admissions. Patient is currently altered and unable to have this conversation, and family was unable to be reached at this time. This will need to be clarified once patient's mental status improves or family can be reached. Code Status History    Date Active Date Inactive Code Status Order ID Comments User Context   07/13/2015 12:50 AM 07/14/2015  3:44 PM Full Code [id number]  Milagros Loll, MD ED   06/19/2015  5:00 PM 06/26/2015  4:26 PM Full Code [id number]  Katha Hamming, MD ED      TOTAL TIME TAKING CARE OF THIS PATIENT: 45 minutes.    Jonanthony Nahar FIELDING 08/01/2015, 9:52 PM  [facility]  Office  [phone]  CC: Primary care physician; Rafael Bihari, MD

## 2015-08-02 LAB — BASIC METABOLIC PANEL
Anion gap: 3 — ABNORMAL LOW (ref 5–15)
BUN: 9 mg/dL (ref 6–20)
CHLORIDE: 106 mmol/L (ref 101–111)
CO2: 31 mmol/L (ref 22–32)
CREATININE: 0.64 mg/dL (ref 0.61–1.24)
Calcium: 8 mg/dL — ABNORMAL LOW (ref 8.9–10.3)
GFR calc Af Amer: >60 mL/min (ref >60)
GFR calc non Af Amer: >60 mL/min (ref >60)
Glucose, Bld: 88 mg/dL (ref 65–99)
Potassium: 3.1 mmol/L — ABNORMAL LOW (ref 3.5–5.1)
SODIUM: 140 mmol/L (ref 135–145)

## 2015-08-02 LAB — CBC
HCT: 27.8 % — ABNORMAL LOW (ref 40.0–52.0)
HEMATOCRIT: 28 % — AB (ref 40.0–52.0)
HEMOGLOBIN: 9.2 g/dL — AB (ref 13.0–18.0)
Hemoglobin: 9.2 g/dL — ABNORMAL LOW (ref 13.0–18.0)
MCH: 30 pg (ref 26.0–34.0)
MCH: 30.4 pg (ref 26.0–34.0)
MCHC: 32.8 g/dL (ref 32.0–36.0)
MCHC: 33 g/dL (ref 32.0–36.0)
MCV: 91.4 fL (ref 80.0–100.0)
MCV: 92.3 fL (ref 80.0–100.0)
PLATELETS: 126 10*3/uL — AB (ref 150–440)
Platelets: 130 10*3/uL — ABNORMAL LOW (ref 150–440)
RBC: 3.06 MIL/uL — ABNORMAL LOW (ref 4.40–5.90)
RBC: 3.01 MIL/uL — ABNORMAL LOW (ref 4.40–5.90)
RDW: 15.8 % — ABNORMAL HIGH (ref 11.5–14.5)
RDW: 15.8 % — AB (ref 11.5–14.5)
WBC: 9.7 10*3/uL (ref 3.8–10.6)
WBC: 9.7 10*3/uL (ref 3.8–10.6)

## 2015-08-02 LAB — INFLUENZA PANEL BY PCR (TYPE A & B)
H1N1 flu by pcr: NOT DETECTED
Influenza A By PCR: NEGATIVE
Influenza B By PCR: NEGATIVE

## 2015-08-02 LAB — CREATININE, SERUM
Creatinine, Ser: 0.59 mg/dL — ABNORMAL LOW (ref 0.61–1.24)
GFR calc Af Amer: >60 mL/min (ref >60)

## 2015-08-02 LAB — MAGNESIUM: Magnesium: 1.6 mg/dL — ABNORMAL LOW (ref 1.7–2.4)

## 2015-08-02 LAB — TROPONIN I
TROPONIN I: 0.05 ng/mL — AB (ref <0.031)
Troponin I: 0.04 ng/mL — ABNORMAL HIGH (ref <0.031)
Troponin I: 0.04 ng/mL — ABNORMAL HIGH (ref <0.031)

## 2015-08-02 LAB — PHOSPHORUS: PHOSPHORUS: 2.8 mg/dL (ref 2.5–4.6)

## 2015-08-02 MED ORDER — NYSTATIN 100000 UNIT/ML MT SUSP
5.0000 mL | Freq: Four times a day (QID) | OROMUCOSAL | Status: DC
  Administered 2015-08-02 – 2015-08-04 (×8): 500000 [IU] via ORAL
  Filled 2015-08-02 (×9): qty 5

## 2015-08-02 MED ORDER — MEGESTROL ACETATE 400 MG/10ML PO SUSP: 400.0000 mg | Freq: Every day | ORAL | Status: DC

## 2015-08-02 MED ORDER — SODIUM CHLORIDE 0.9 % IV SOLN
INTRAVENOUS | Status: AC
  Administered 2015-08-02: 11:00:00 via INTRAVENOUS

## 2015-08-02 NOTE — Plan of Care (Signed)
Problem: Activity: Goal: Risk for activity intolerance will decrease Outcome: Not Progressing Pt has not been active during the night. Needs physical therapy due to weakness.   Problem: Nutrition: Goal: Adequate nutrition will be maintained Outcome: Not Applicable Date Met:  82/57/49 Pt NPO

## 2015-08-02 NOTE — Progress Notes (Signed)
PT Cancellation Note  Patient Details Name: Edwin Sutton MRN: [id number] DOB: 01-12-1929   Cancelled Treatment:    Reason Eval/Treat Not Completed: Fatigue/lethargy limiting ability to participate (Consult received and chart reviewed.  Patient sleeping heavily upon arrival.  Opens eyes briefly to max stimulation, but unable to maintain alertness for participation with session at this time.  Will re-attempt at later time/date as patient medically appropriate and able to participate.)   Cyerra Yim H. Manson Passey, PT, DPT, NCS 08/02/2015, 11:55 AM [phone]

## 2015-08-02 NOTE — Evaluation (Signed)
Clinical/Bedside Swallow Evaluation Patient Details  Name: Edwin Sutton MRN: [id number] Date of Birth: Jun 13, 1928  Today's Date: 08/02/2015 Time: SLP Start Time (ACUTE ONLY): 1400 SLP Stop Time (ACUTE ONLY): 1500 SLP Time Calculation (min) (ACUTE ONLY): 60 min  Past Medical History:  Past Medical History  Diagnosis Date  . Hypertension   . Arthritis   . HLD (hyperlipidemia)   . Renal failure   . BPH (benign prostatic hyperplasia)    Past Surgical History:  Past Surgical History  Procedure Laterality Date  . Total hip arthroplasty Bilateral    HPI:  Pt is a 82 y.o. male w/ h/o HTN, renal failure, and other medical issues who presents with weakness and encephalopathy. Patient was admitted here in the hospital about a month ago and was treated for pneumonia. Since that time he has had a steady decline again at the facility where he stays. He is been more lethargic the last week or so, with reduced appetite and over the last couple of days increased confusion. He is brought back into the ED for evaluation and found to have recurrent or persistent left-sided pneumonia and FTT.    Assessment / Plan / Recommendation Clinical Impression  Pt appeared to present w/ such increased lethargy that it impacted his ability to participate safely in taking po trials; pt required max. verbal/tactile cues to follow through w/ the swallowing tasks as he tended to close his eyes and fall asleep again. No apparent oral weakness noted; no unilateral flaccidity. Pharyngeal swallowing and oral clearing was noted given time and cues. Pt demo. decreased awareness overall for the task of swallowing. He is at increased risk for aspiration at this time. MD consulted and rec. given for NPO including meds unless meds are necessary and can be crushed in puree. MD agreed. NSG updated. ST services will f/u w/ pt's status and po trials to hopefully establish least restrictive diet consistency.     Aspiration Risk  Moderate  aspiration risk (d/t lethargy)    Diet Recommendation  NPO w/ continued po trials/assessment; strict aspiration precautions; oral care  Medication Administration: Crushed with puree    Other  Recommendations Recommended Consults:  (Dietician) Oral Care Recommendations: Oral care QID;Staff/trained caregiver to provide oral care Other Recommendations:  (TBD)   Follow up Recommendations  Skilled Nursing facility (TBD)    Frequency and Duration min 3x week  2 weeks       Prognosis Prognosis for Safe Diet Advancement: Fair Barriers to Reach Goals: Cognitive deficits (lethargy)      Swallow Study   General Date of Onset: 08/01/15 HPI: Pt is a 82 y.o. male w/ h/o HTN, renal failure, and other medical issues who presents with weakness and encephalopathy. Patient was admitted here in the hospital about a month ago and was treated for pneumonia. Since that time he has had a steady decline again at the facility where he stays. He is been more lethargic the last week or so, with reduced appetite and over the last couple of days increased confusion. He is brought back into the ED for evaluation and found to have recurrent or persistent left-sided pneumonia and FTT.  Type of Study: Bedside Swallow Evaluation Previous Swallow Assessment: unknown Diet Prior to this Study:  (unknown - FTT per chart) Temperature Spikes Noted: No (wbc 9.7) Respiratory Status: Room air History of Recent Intubation: No Behavior/Cognition: Lethargic/Drowsy;Requires cueing;Doesn't follow directions Oral Cavity Assessment:  (unable to assess fully d/t lethargy; dry lips) Oral Care Completed by SLP: Yes  Oral Cavity - Dentition: Missing dentition (front lower teeth only) Vision:  (unknown) Self-Feeding Abilities: Total assist Patient Positioning: Upright in bed Baseline Vocal Quality:  (mumbled speech only) Volitional Cough: Cognitively unable to elicit Volitional Swallow: Unable to elicit    Oral/Motor/Sensory  Function Overall Oral Motor/Sensory Function:  (unable to assess sec. to declined Cognition/lethargy)   Ice Chips Ice chips: Impaired Presentation: Spoon (fed; 3 trials) Oral Phase Impairments: Poor awareness of bolus Oral Phase Functional Implications: Prolonged oral transit Pharyngeal Phase Impairments: Suspected delayed Swallow Other Comments: slow follow through w/ swallowing   Thin Liquid Thin Liquid: Not tested    Nectar Thick Nectar Thick Liquid: Impaired Presentation: Straw (pinched straw x3 trials) Oral Phase Impairments: Reduced labial seal;Poor awareness of bolus Oral phase functional implications: Prolonged oral transit Pharyngeal Phase Impairments: Suspected delayed Swallow   Honey Thick Honey Thick Liquid: Not tested   Puree Puree: Impaired Presentation: Spoon (fed; 2 trials) Oral Phase Impairments: Reduced labial seal;Poor awareness of bolus Oral Phase Functional Implications: Prolonged oral transit Pharyngeal Phase Impairments: Suspected delayed Swallow   Solid   GO   Solid: Not tested       Jerilynn Som, MS, CCC-SLP  Watson,Katherine 08/02/2015,3:57 PM

## 2015-08-02 NOTE — Care Management (Signed)
Admitted to [facility] with the diagnosis of pneumonia. Lives with sons [name] and Alinda Money x 8 years.  Daughter is [name] [phone])  Last seen Dr. Yates Decamp 3 weeks ago. Followed by [facility]. Followed by [facility] since 07/13/14. [facility] following hip surgery 4-5 years ago. Fell yesterday, was going down the stairs to go to an appointment with Dr. Dan Humphreys. Decreased appetite for a couple of days. Son states he only drinks Ensure, but hasn't drank any in a couple of days. Rolling walker and bedside bed in the home. Son states he has been sleeping a lot.  Gwenette Greet RN MSN CCM Care Management [phone]

## 2015-08-02 NOTE — Progress Notes (Signed)
[facility] [place] at [facility]                                                                                                                                                                                            Patient Demographics   Edwin Sutton, is a 82 y.o. male, DOB - Apr 10, 1929, ZOX:[id number]  Admit date - 08/01/2015   Admitting Physician Oralia Manis, MD  Outpatient Primary MD for the patient is Danne Harbor, Letta Pate, MD   LOS - 1  Subjective: Patient admitted with decreased by mouth intake and failure to thrive weakness noted to have worsening pneumonia. Currently drowsy and unable to provide any history her son is at the bedside.     Review of Systems:   CONSTITUTIONAL: Unable to provide due to patient's mental status  Vitals:   Filed Vitals:   08/02/15 0436 08/02/15 0948 08/02/15 1305 08/02/15 1315  BP: 105/56  Pulse: 78 74 68 68  Temp: 97.7 F (36.5 C)  98.3 F (36.8 C)   TempSrc: Oral  Oral   Resp: 18  18   Height:      Weight:      SpO2: 100% 100% 99%     Wt Readings from Last 3 Encounters:  08/01/15 74.072 kg (163 lb 4.8 oz)  07/25/15 87.091 kg (192 lb)  07/14/15 83.643 kg (184 lb 6.4 oz)     Intake/Output Summary (Last 24 hours) at 08/02/15 1549 Last data filed at 08/02/15 0733  Gross per 24 hour  Intake 941.67 ml  Output      0 ml  Net 941.67 ml    Physical Exam:   GENERAL: Chronically ill-appearing male HEAD, EYES, EARS, NOSE AND THROAT: Atraumatic, normocephalic. Extraocular muscles are intact. Pupils equal and reactive to light. Sclerae anicteric. No conjunctival injection. No oro-pharyngeal erythema. Oropharynx is very dry NECK: Supple. There is no jugular venous distention. No bruits, no lymphadenopathy, no thyromegaly.  HEART: Regular rate and rhythm,. No murmurs, no rubs, no clicks.  LUNGS: Rhonchus breath sounds bilaterally ABDOMEN: Soft, flat, nontender, nondistended. Has good bowel  sounds. No hepatosplenomegaly appreciated.  EXTREMITIES: No evidence of any cyanosis, clubbing, or peripheral edema.  +2 pedal and radial pulses bilaterally.  NEUROLOGIC: He very drowsy S KIN: Moist and warm with no rashes appreciated.  Psych: Drowsy LN: No inguinal LN enlargement    Antibiotics   Anti-infectives    Start     Dose/Rate Route Frequency Ordered Stop   08/02/15 0200  vancomycin (VANCOCIN) IVPB 1000 mg/200 mL premix     1,000 mg 200 mL/hr over  60 Minutes Intravenous Every 12 hours 08/01/15 2356     08/02/15 0200  piperacillin-tazobactam (ZOSYN) IVPB 3.375 g     3.375 g 12.5 mL/hr over 240 Minutes Intravenous 3 times per day 08/01/15 2356     08/01/15 1815  vancomycin (VANCOCIN) 1,250 mg in sodium chloride 0.9 % 250 mL IVPB     1,250 mg 166.7 mL/hr over 90 Minutes Intravenous  Once 08/01/15 1753 08/01/15 1958   08/01/15 1800  piperacillin-tazobactam (ZOSYN) IVPB 3.375 g     3.375 g 100 mL/hr over 30 Minutes Intravenous  Once 08/01/15 1753 08/01/15 1859      Medications   Scheduled Meds: . enoxaparin (LOVENOX) injection  40 mg Subcutaneous QHS  . nystatin  5 mL Oral QID  . piperacillin-tazobactam (ZOSYN)  IV  3.375 g Intravenous 3 times per day  . sodium chloride flush  3 mL Intravenous Q12H  . vancomycin  1,000 mg Intravenous Q12H   Continuous Infusions: . sodium chloride 75 mL/hr at 08/02/15 1115   PRN Meds:.acetaminophen **OR** acetaminophen, ondansetron **OR** ondansetron (ZOFRAN) IV   Data Review:   Micro Results Recent Results (from the past 240 hour(s))  Culture, blood (Routine X 2) w Reflex to ID Panel     Status: None (Preliminary result)   Collection Time: 08/02/15 12:55 AM  Result Value Ref Range Status   Specimen Description BLOOD LEFT ANTECUBITAL  Final   Special Requests BOTTLES DRAWN AEROBIC AND ANAEROBIC  Final   Culture NO GROWTH < 12 HOURS  Final   Report Status PENDING  Incomplete  Culture, blood (Routine X 2) w Reflex to ID Panel      Status: None (Preliminary result)   Collection Time: 08/02/15  1:01 AM  Result Value Ref Range Status   Specimen Description BLOOD LEFT HAND  Final   Special Requests BOTTLES DRAWN AEROBIC AND ANAEROBIC  Final   Culture NO GROWTH < 12 HOURS  Final   Report Status PENDING  Incomplete    Radiology Reports Dg Chest 2 View  07/12/2015  CLINICAL DATA:  82 year old male with shortness of breath EXAM: CHEST  2 VIEW COMPARISON:  Radiograph dated 06/19/2015 FINDINGS: Two views of the chest demonstrate a focal area of increased density in the left infrahilar region which may represent atelectatic changes. Developing pneumonia is not excluded. Left lung base linear and platelike atelectatic changes noted. The right lung is clear. No pleural effusion or pneumothorax. The cardiac silhouette is within normal limits. The thoracic aorta is tortuous. No acute osseous pathology. IMPRESSION: Focal left infrahilar atelectasis versus developing pneumonia. Clinical correlation and follow-up recommended. Electronically Signed   By: Elgie Collard M.D.   On: 07/12/2015 22:32   Dg Chest Portable 1 View  08/01/2015  CLINICAL DATA:  Weakness EXAM: PORTABLE CHEST 1 VIEW COMPARISON:  07/12/2015 FINDINGS: Low lung volumes. Left mid and lower lung zone consolidation. Normal heart size. No pneumothorax. IMPRESSION: Left lower lobe consolidation. Followup PA and lateral chest X-ray is recommended in 3-4 weeks following trial of antibiotic therapy to ensure resolution and exclude underlying malignancy. Electronically Signed   By: Jolaine Click M.D.   On: 08/01/2015 16:53   Dg Shoulder Left  08/01/2015  CLINICAL DATA:  82 year old male complaining of weakness today. History of trauma from a fall this morning complaining of left-sided shoulder pain. EXAM: LEFT SHOULDER - 2+ VIEW COMPARISON:  No priors. FINDINGS: No acute displaced fracture. Mild degenerative changes of osteoarthritis including joint space narrowing,  subchondral sclerosis  and osteophyte formation are noted in the glenohumeral and acromioclavicular joints. Left shoulder is located. IMPRESSION: 1. No acute radiographic abnormality of the left shoulder. 2. Mild osteoarthritis in the glenohumeral and acromioclavicular joints. Electronically Signed   By: Trudie Reed M.D.   On: 08/01/2015 13:04     CBC  Recent Labs Lab 08/01/15 1158 08/02/15 0101 08/02/15 0528  WBC 10.0 9.7 9.7  HGB 10.4* 9.2* 9.2*  HCT 31.3* 28.0* 27.8*  PLT 140* 130* 126*  MCV 91.9 91.4 92.3  MCH 30.5 30.0 30.4  MCHC 33.2 32.8 33.0  RDW 16.1* 15.8* 15.8*  LYMPHSABS 0.7*  --   --   MONOABS 0.5  --   --   EOSABS 0.0  --   --   BASOSABS 0.0  --   --     Chemistries   Recent Labs Lab 08/01/15 1158 08/02/15 0101 08/02/15 0528  NA 142  --  140  K 3.1*  --  3.1*  CL 103  --  106  CO2 26  --  31  GLUCOSE 132*  --  88  BUN 11  --  9  CREATININE 0.62 0.59* 0.64  CALCIUM 8.8*  --  8.0*  MG  --  1.6*  --   AST 37  --   --   ALT 12*  --   --   ALKPHOS 66  --   --   BILITOT 1.4*  --   --    ------------------------------------------------------------------------------------------------------------------ estimated creatinine clearance is 72 mL/min (by C-G formula based on Cr of 0.64). ------------------------------------------------------------------------------------------------------------------ No results for input(s): HGBA1C in the last 72 hours. ------------------------------------------------------------------------------------------------------------------ No results for input(s): CHOL, HDL, LDLCALC, TRIG, CHOLHDL, LDLDIRECT in the last 72 hours. ------------------------------------------------------------------------------------------------------------------ No results for input(s): TSH, T4TOTAL, T3FREE, THYROIDAB in the last 72 hours.  Invalid input(s):  FREET3 ------------------------------------------------------------------------------------------------------------------ No results for input(s): VITAMINB12, FOLATE, FERRITIN, TIBC, IRON, RETICCTPCT in the last 72 hours.  Coagulation profile No results for input(s): INR, PROTIME in the last 168 hours.  No results for input(s): DDIMER in the last 72 hours.  Cardiac Enzymes  Recent Labs Lab 08/02/15 0101 08/02/15 0528 08/02/15 1146  TROPONINI 0.05* 0.04* 0.04*   ------------------------------------------------------------------------------------------------------------------ Invalid input(s): POCBNP    Assessment & Plan  Patient is a 82 year old African-American male recently hospitalized for pneumonia brought back to the hospital for same   1. HCAP (healthcare-associated pneumonia) -  Continue vancomycin and Zosyn for now. I have asked speech to evaluate the patient. He was seen by them and they recommended nothing by mouth for now until more awake  2.  Acute delirium - due to dehydration and pneumonia  3.  Benign essential HTN - blood pressure medications on hold due to unable to swallow he was hypotensive earlier .  4.  Paroxysmal atrial fibrillation (HCC) - currently not on any rate controlling medications no medications at this point.  5.  BPH (benign prostatic hyperplasia) - currently nothing by mouth no medications at this point     Code Status Orders        Start     Ordered   08/01/15 2348  Full code   Continuous     08/01/15 2347    Code Status History    Date Active Date Inactive Code Status Order ID Comments User Context   07/13/2015 12:50 AM 07/14/2015  3:44 PM Full Code [id number]  Milagros Loll, MD ED   06/19/2015  5:00 PM 06/26/2015  4:26 PM Full Code [id number]  Katha Hamming, MD ED           Consults  speech  DVT Prophylaxis  Lovenox   Lab Results  Component Value Date   PLT 126* 08/02/2015     Time Spent in minutes     Greater than 50% of time spent in care coordination and counseling patient regarding the condition and plan of care with son and plane of care   Auburn Bilberry M.D on 08/02/2015 at 3:49 PM  Between 7am to 6pm - Pager - [phone]  After 6pm go to [email or web address] - password [email or web address] [facility]  [facility]   Office  [phone]

## 2015-08-03 LAB — CBC
HEMATOCRIT: 28.1 % — AB (ref 40.0–52.0)
Hemoglobin: 9.4 g/dL — ABNORMAL LOW (ref 13.0–18.0)
MCH: 31 pg (ref 26.0–34.0)
MCHC: 33.2 g/dL (ref 32.0–36.0)
MCV: 93.4 fL (ref 80.0–100.0)
Platelets: 127 10*3/uL — ABNORMAL LOW (ref 150–440)
RBC: 3.01 MIL/uL — ABNORMAL LOW (ref 4.40–5.90)
RDW: 15.9 % — AB (ref 11.5–14.5)
WBC: 10.6 10*3/uL (ref 3.8–10.6)

## 2015-08-03 LAB — POTASSIUM
POTASSIUM: 2.9 mmol/L — AB (ref 3.5–5.1)
Potassium: 2.7 mmol/L — CL (ref 3.5–5.1)

## 2015-08-03 LAB — BASIC METABOLIC PANEL
ANION GAP: 9 (ref 5–15)
BUN: 11 mg/dL (ref 6–20)
CALCIUM: 8 mg/dL — AB (ref 8.9–10.3)
CO2: 26 mmol/L (ref 22–32)
Chloride: 108 mmol/L (ref 101–111)
Creatinine, Ser: 1.23 mg/dL (ref 0.61–1.24)
GFR calc Af Amer: >60 mL/min (ref >60)
GFR calc non Af Amer: 52 mL/min — ABNORMAL LOW (ref >60)
GLUCOSE: 79 mg/dL (ref 65–99)
Potassium: 2.3 mmol/L — CL (ref 3.5–5.1)
Sodium: 143 mmol/L (ref 135–145)

## 2015-08-03 LAB — MAGNESIUM: Magnesium: 1.7 mg/dL (ref 1.7–2.4)

## 2015-08-03 LAB — VANCOMYCIN, TROUGH: Vancomycin Tr: 28 ug/mL (ref 10–20)

## 2015-08-03 MED ORDER — POTASSIUM CHLORIDE 10 MEQ/100ML IV SOLN
10.0000 meq | INTRAVENOUS | Status: AC
  Administered 2015-08-03 (×5): 10 meq via INTRAVENOUS
  Filled 2015-08-03 (×5): qty 100

## 2015-08-03 MED ORDER — ENSURE ENLIVE PO LIQD
237.0000 mL | Freq: Three times a day (TID) | ORAL | Status: DC
  Administered 2015-08-04 (×2): 237 mL via ORAL

## 2015-08-03 MED ORDER — MAGNESIUM SULFATE 2 GM/50ML IV SOLN
2.0000 g | Freq: Once | INTRAVENOUS | Status: AC
  Administered 2015-08-03: 2 g via INTRAVENOUS
  Filled 2015-08-03: qty 50

## 2015-08-03 MED ORDER — MIRTAZAPINE 15 MG PO TABS
7.5000 mg | ORAL_TABLET | Freq: Every day | ORAL | Status: DC
  Filled 2015-08-03: qty 1

## 2015-08-03 MED ORDER — POTASSIUM CHLORIDE 10 MEQ/100ML IV SOLN
10.0000 meq | INTRAVENOUS | Status: AC
Start: 2015-08-03 — End: 2015-08-03
  Administered 2015-08-03 (×4): 10 meq via INTRAVENOUS
  Filled 2015-08-03 (×4): qty 100

## 2015-08-03 MED ORDER — POTASSIUM CHLORIDE 20 MEQ PO PACK
20.0000 meq | PACK | Freq: Once | ORAL | Status: AC
  Administered 2015-08-03: 20 meq via ORAL
  Filled 2015-08-03: qty 1

## 2015-08-03 MED ORDER — POTASSIUM CHLORIDE 10 MEQ/100ML IV SOLN
10.0000 meq | INTRAVENOUS | Status: DC
  Filled 2015-08-03 (×4): qty 100

## 2015-08-03 MED ORDER — TAMSULOSIN HCL 0.4 MG PO CAPS
0.4000 mg | ORAL_CAPSULE | Freq: Every day | ORAL | Status: DC
  Administered 2015-08-03: 0.4 mg via ORAL
  Filled 2015-08-03 (×2): qty 1

## 2015-08-03 NOTE — Progress Notes (Signed)
Pharmacy Antibiotic Note  Edwin Sutton is a 82 y.o. male admitted on 08/01/2015 with pneumonia.  Pharmacy has been consulted for Vancomycin and Zosyn dosing.  Plan: Trough today at 1251 (~drawn ~1 hour early) of 34.  Spoke with RN, no Vancomycin given. SCr doubled overnight. Will recheck trough in ~12 hours at 0100 to help guide dosing.  If trough is within desired range of 15-20 mcg/mL, then consider q24h dosing interval.   Continue Zosyn 3.375 g IV q8h per EI protocol.   Height: 6' (182.9 cm) Weight: 163 lb 4.8 oz (74.072 kg) IBW/kg (Calculated) : 77.6  Temp (24hrs), Avg:98.3 F (36.8 C), Min:97.5 F (36.4 C), Max:98.8 F (37.1 C)   Recent Labs Lab 08/01/15 1158 08/01/15 1511 08/02/15 0101 08/02/15 0528 08/03/15 0501 08/03/15 1251  WBC 10.0  --  9.7 9.7 10.6  --   CREATININE 0.62  --  0.59* 0.64 1.23  --   LATICACIDVEN 2.4* 1.6  --   --   --   --   VANCOTROUGH  --   --   --   --   --  28*    Estimated Creatinine Clearance: 46.9 mL/min (by C-G formula based on Cr of 1.23).    No Known Allergies  Antimicrobials this admission: Vancomycin 3/1 >>  Zosyn 3/1 >>   Dose adjustments this admission: Trough of 28.  Hold Vancomycin dose and recheck trough in 12 hours at 0100.    Microbiology results: 3/1 BCx: NG <12 hours   Thank you for allowing pharmacy to be a part of this patient's care.  Stesha Neyens G 08/03/2015 2:18 PM

## 2015-08-03 NOTE — Clinical Social Work Placement (Signed)
   CLINICAL SOCIAL WORK PLACEMENT  NOTE  Date:  08/03/2015  Patient Details  Name: Edwin Sutton MRN: [id number] Date of Birth: [birth year]  Clinical Social Work is seeking post-discharge placement for this patient at the Skilled  Nursing Facility level of care (*CSW will initial, date and re-position this form in  chart as items are completed):  Yes   Patient/family provided with [facility] Social Work Department's list of facilities offering this level of care within the geographic area requested by the patient (or if unable, by the patient's family).  Yes   Patient/family informed of their freedom to choose among providers that offer the needed level of care, that participate in Medicare, Medicaid or managed care program needed by the patient, have an available bed and are willing to accept the patient.  Yes   Patient/family informed of [facility]'s ownership interest in [facility] and [facility], as well as of the fact that they are under no obligation to receive care at these facilities.  PASRR submitted to EDS on       PASRR number received on       Existing PASRR number confirmed on 08/03/15     FL2 transmitted to all facilities in geographic area requested by pt/family on 08/03/15     FL2 transmitted to all facilities within larger geographic area on       Patient informed that his/her managed care company has contracts with or will negotiate with certain facilities, including the following:            Patient/family informed of bed offers received.  Patient chooses bed at       Physician recommends and patient chooses bed at      Patient to be transferred to   on  .  Patient to be transferred to facility by       Patient family notified on   of transfer.  Name of family member notified:        PHYSICIAN       Additional Comment:    _______________________________________________ Dede Query, LCSW 08/03/2015, 2:45 PM

## 2015-08-03 NOTE — Progress Notes (Signed)
[facility] - [place] at [facility]   PATIENT NAME: Edwin Sutton    MR#:  [id number]  DATE OF BIRTH:  [birth year]  SUBJECTIVE:    Patient alert and awake this morning. Reports no cough, shortness of breath or chest pain  REVIEW OF SYSTEMS:    Review of Systems  Constitutional: Negative for fever, chills and malaise/fatigue.  HENT: Negative for sore throat.   Eyes: Negative for blurred vision.  Respiratory: Negative for cough, hemoptysis, shortness of breath and wheezing.   Cardiovascular: Negative for chest pain, palpitations and leg swelling.  Gastrointestinal: Negative for nausea, vomiting, abdominal pain, diarrhea and blood in stool.  Genitourinary: Negative for dysuria.  Musculoskeletal: Negative for back pain.  Neurological: Negative for dizziness, tremors and headaches.  Endo/Heme/Allergies: Does not bruise/bleed easily.    Tolerating Diet: Yes      DRUG ALLERGIES:  No Known Allergies  VITALS:  Blood pressure 92/62, pulse 70, temperature 97.5 F (36.4 C), temperature source Oral, resp. rate 18, height 6' (1.829 m), weight 74.072 kg (163 lb 4.8 oz), SpO2 99 %.  PHYSICAL EXAMINATION:   Physical Exam  Constitutional: He is well-developed, well-nourished, and in no distress. No distress.  HENT:  Head: Normocephalic.  Eyes: No scleral icterus.  Neck: Normal range of motion. Neck supple. No JVD present. No tracheal deviation present.  Cardiovascular: Normal rate, regular rhythm and normal heart sounds.  Exam reveals no gallop and no friction rub.   No murmur heard. Pulmonary/Chest: Effort normal and breath sounds normal. No respiratory distress. He has no wheezes. He has no rales. He exhibits no tenderness.  Abdominal: Soft. Bowel sounds are normal. He exhibits no distension and no mass. There is no tenderness. There is no rebound and no guarding.  Musculoskeletal: Normal range of motion. He exhibits no edema.  Neurological: He is alert.   Skin: Skin is warm. No rash noted. No erythema.  Psychiatric: Mood and affect normal.      LABORATORY PANEL:   CBC  Recent Labs Lab 08/03/15 0501  WBC 10.6  HGB 9.4*  HCT 28.1*  PLT 127*   ------------------------------------------------------------------------------------------------------------------  Chemistries   Recent Labs Lab 08/01/15 1158 08/02/15 0101  08/03/15 0501  NA 142  --   < > 143  K 3.1*  --   < > 2.3*  CL 103  --   < > 108  CO2 26  --   < > 26  GLUCOSE 132*  --   < > 79  BUN 11  --   < > 11  CREATININE 0.62 0.59*  < > 1.23  CALCIUM 8.8*  --   < > 8.0*  MG  --  1.6*  --   --   AST 37  --   --   --   ALT 12*  --   --   --   ALKPHOS 66  --   --   --   BILITOT 1.4*  --   --   --   < > = values in this interval not displayed. ------------------------------------------------------------------------------------------------------------------  Cardiac Enzymes  Recent Labs Lab 08/02/15 0101 08/02/15 0528 08/02/15 1146  TROPONINI 0.05* 0.04* 0.04*   ------------------------------------------------------------------------------------------------------------------  RADIOLOGY:  Dg Chest Portable 1 View  08/01/2015  CLINICAL DATA:  Weakness EXAM: PORTABLE CHEST 1 VIEW COMPARISON:  07/12/2015 FINDINGS: Low lung volumes. Left mid and lower lung zone consolidation. Normal heart size. No pneumothorax. IMPRESSION: Left lower lobe consolidation. Followup PA and lateral chest X-ray  is recommended in 3-4 weeks following trial of antibiotic therapy to ensure resolution and exclude underlying malignancy. Electronically Signed   By: Jolaine Click M.D.   On: 08/01/2015 16:53   Dg Shoulder Left  08/01/2015  CLINICAL DATA:  82 year old male complaining of weakness today. History of trauma from a fall this morning complaining of left-sided shoulder pain. EXAM: LEFT SHOULDER - 2+ VIEW COMPARISON:  No priors. FINDINGS: No acute displaced fracture. Mild degenerative  changes of osteoarthritis including joint space narrowing, subchondral sclerosis and osteophyte formation are noted in the glenohumeral and acromioclavicular joints. Left shoulder is located. IMPRESSION: 1. No acute radiographic abnormality of the left shoulder. 2. Mild osteoarthritis in the glenohumeral and acromioclavicular joints. Electronically Signed   By: Trudie Reed M.D.   On: 08/01/2015 13:04     ASSESSMENT AND PLAN:   82 year old male with history of BPH who presented with weakness and encephalopathy.   1. Acute metabolic encephalopathy due to healthcare associated pneumonia: Encephalopathy has resolved. Patient is at his baseline.  2. Healthcare associated pneumonia, left lower lobe consolidation: Lactic acid is improved. Blood cultures are pending. Continue vancomycin and Zosyn. Patient tolerated his oral diet with out any symptoms of aspiration.   3. BPH: Continue Flomax.   4. Severe hypokalemia: Replete potassium. Check magnesium and replete if needed. Recheck in a.m..   5. Elevated troponin: This due to demand ischemia and not ACS.    PT consult.    Management plans discussed with the patient's son and he is in agreement.  CODE STATUS: FULL (family to discuss this)  TOTAL TIME TAKING CARE OF THIS PATIENT: 30 minutes.     POSSIBLE D/C 1-2 days, DEPENDING ON CLINICAL CONDITION.   Edwin Sutton M.D on 08/03/2015 at 10:45 AM  Between 7am to 6pm - Pager - [phone] After 6pm go to [email or web address] - password [email or web address]  [facility]  Office  [phone]  CC: Primary care physician; Rafael Bihari, MD  Note: This dictation was prepared with Dragon dictation along with smaller phrase technology. Any transcriptional errors that result from this process are unintentional.

## 2015-08-03 NOTE — Plan of Care (Signed)
Problem: SLP Dysphagia Goals Goal: Misc Dysphagia Goal Pt will safely tolerate po diet of least restrictive consistency w/ no overt s/s of aspiration noted by Staff/pt/family x3 sessions.    

## 2015-08-03 NOTE — NC FL2 (Signed)
[place] MEDICAID FL2 LEVEL OF CARE SCREENING TOOL     IDENTIFICATION  Patient Name: Edwin Sutton Birthdate: 1929-05-10 Sex: male Admission Date (Current Location): 08/01/2015  [place] and [place] Number:  Chiropodist and Address:   [facility] [address]  [place], [place] [address]      Provider Number: [id number]  Attending Physician Name and Address:  Adrian Saran, MD  Relative Name and Phone Number:       Current Level of Care: Hospital Recommended Level of Care: Skilled Nursing Facility Prior Approval Number:    Date Approved/Denied:   PASRR Number: [id number]  Discharge Plan: SNF    Current Diagnoses: Patient Active Problem List   Diagnosis Date Noted  . HCAP (healthcare-associated pneumonia) 08/01/2015  . BPH (benign prostatic hyperplasia) 08/01/2015  . Pneumonia 07/13/2015  . Urinary retention 07/05/2015  . Acute delirium 06/23/2015  . Pressure ulcer 06/20/2015  . Acute renal failure (ARF) (HCC) 06/19/2015  . Breathlessness on exertion 06/01/2015  . Benign essential HTN 05/24/2015  . Combined fat and carbohydrate induced hyperlipemia 05/17/2015  . Paroxysmal atrial fibrillation (HCC) 05/17/2015  . Thrombocytopenia (HCC) 01/16/2015  . Cerebrovascular accident (CVA) (HCC) 08/29/2014    Orientation RESPIRATION BLADDER Height & Weight     Self, Time, Situation, Place  Normal Incontinent Weight: 163 lb 4.8 oz (74.072 kg) Height:  6' (182.9 cm)  BEHAVIORAL SYMPTOMS/MOOD NEUROLOGICAL BOWEL NUTRITION STATUS      Incontinent Diet (DYS 2, Thin Liquids, Aspiration Precuations, Please send extra gravy on the ground meats and mashed potatoes; send a pudding at lunch meals. Hot cereal at breakfast meal w/ butter and sugar.)  AMBULATORY STATUS COMMUNICATION OF NEEDS Skin   Extensive Assist Verbally Normal                       Personal Care Assistance Level of Assistance  Bathing, Feeding, Dressing Bathing Assistance:  Limited assistance Feeding assistance: Limited assistance Dressing Assistance: Limited assistance     Functional Limitations Info  Sight, Hearing, Speech Sight Info: Adequate Hearing Info: Adequate Speech Info: Adequate    SPECIAL CARE FACTORS FREQUENCY  PT (By licensed PT), Speech therapy     PT Frequency: 5       Speech Therapy Frequency: 5      Contractures      Additional Factors Info  Code Status, Allergies Code Status Info: Full Code Allergies Info: No known allergies           Current Medications (08/03/2015):  This is the current hospital active medication list Current Facility-Administered Medications  Medication Dose Route Frequency Provider Last Rate Last Dose  . acetaminophen (TYLENOL) tablet 650 mg  650 mg Oral Q6H PRN Oralia Manis, MD       Or  . acetaminophen (TYLENOL) suppository 650 mg  650 mg Rectal Q6H PRN Oralia Manis, MD      . enoxaparin (LOVENOX) injection 40 mg  40 mg Subcutaneous QHS Oralia Manis, MD   40 mg at 08/02/15 2128  . feeding supplement (ENSURE ENLIVE) (ENSURE ENLIVE) liquid 237 mL  237 mL Oral TID WC Sital Mody, MD   237 mL at 08/03/15 1200  . mirtazapine (REMERON) tablet 7.5 mg  7.5 mg Oral QHS Sital Mody, MD      . nystatin (MYCOSTATIN) 100000 UNIT/ML suspension 500,000 Units  5 mL Oral QID Oralia Manis, MD   500,000 Units at 08/03/15 1012  . ondansetron (ZOFRAN) tablet 4 mg  4 mg Oral Q6H PRN Oralia Manis, MD       Or  . ondansetron [facility]) injection 4 mg  4 mg Intravenous Q6H PRN Oralia Manis, MD      . piperacillin-tazobactam (ZOSYN) IVPB 3.375 g  3.375 g Intravenous 3 times per day Oralia Manis, MD   3.375 g at 08/03/15 1018  . potassium chloride 10 mEq in 100 mL IVPB  10 mEq Intravenous Q1 Hr x 5 Pavan Pyreddy, MD   10 mEq at 08/03/15 1146  . sodium chloride flush (NS) 0.9 % injection 3 mL  3 mL Intravenous Q12H Oralia Manis, MD   3 mL at 08/03/15 1000  . tamsulosin (FLOMAX) capsule 0.4 mg  0.4 mg Oral QHS Sital Mody, MD       . vancomycin (VANCOCIN) IVPB 1000 mg/200 mL premix  1,000 mg Intravenous Q12H Oralia Manis, MD   1,000 mg at 08/03/15 1610     Discharge Medications: Please see discharge summary for a list of discharge medications.  Relevant Imaging Results:  Relevant Lab Results:   Additional Information SSN:  [id number]  Dede Query, LCSW

## 2015-08-03 NOTE — Progress Notes (Cosign Needed)
Speech Language Pathology Treatment: Dysphagia  Patient Details Name: Edwin Sutton MRN: [id number] DOB: June 17, 1928 Today's Date: 08/03/2015 Time: 4098-1191 SLP Time Calculation (min) (ACUTE ONLY): 45 min  Assessment / Plan / Recommendation Clinical Impression  Pt presented w/ increased alertness and awakened fully to participate in po trials this morning. Noted Cognitive decline in areas of following through w/ instructions and answering some questions re: self and orientation. Pt's brother present and stated pt had not been eating much at home but that he did drink Ensure at times. Noted FTT dx in MD's initial notes. Pt required cues to assist in sitting up in bed for eating/drinking as well as during self feeding - he often seemed stiff and was unsure of task. Once assisted hand over hand, pt consume po trials of thin liquids via cup and fed trials of puree and softened solids w/ no overt s/s of aspiration noted. Pt exhibited increased oral phase time w/ increased textured foods moreso potentially d/t lacking sufficient dentition but suspect d/t some impact of declined Cognitive status sec. to the increased lingual clearing(and oral phase time) post swallowing. Given assistance and monitoring during po intake, pt appeared to tolerate po's given w/ no overt s/s of aspiration. Rec. A Dys. Level 2 w/ thin liquids; aspiration precautions; meds in puree; assistance at meals d/t pt's min. Increased risk for aspiration. Rec. F/u assessment of pt's baseline Cognitive status as Cognitive decline is not a dx in pt's chart; Cognitive decline can impact nutrition/hydration desire/intake. ST will f/u while admitted as indicated. Brother and pt agreed.   HPI HPI: Pt is a 82 y.o. male w/ h/o HTN, renal failure, and other medical issues who presents with weakness and encephalopathy. Patient was admitted here in the hospital about a month ago and was treated for pneumonia. Since that time he has had a steady decline  again at the facility where he stays. He is been more lethargic the last week or so, with reduced appetite and over the last couple of days increased confusion. He is brought back into the ED for evaluation and found to have recurrent or persistent left-sided pneumonia and FTT. Pt was more lethargic at BSE yesterday and was rec'd to remain NPO. However, this AM, pt readily awakens and is verbally interactive w/ SLP; brother present in room. Pt exhibited signs of confusion in following through w/ commands/instructions and required verbal cues. He was oriented to self and place only. Speech is mumbled and impacted by lacking sufficient dentition.       SLP Plan  Continue with current plan of care     Recommendations  Diet recommendations: Dysphagia 2 (fine chop);Thin liquid Liquids provided via: Cup;Straw Medication Administration: Whole meds with puree (crushed if Arther Dames.) Supervision: Staff to assist with self feeding;Full supervision/cueing for compensatory strategies Compensations: Minimize environmental distractions;Slow rate;Small sips/bites;Follow solids with liquid (time for full oral clearing) Postural Changes and/or Swallow Maneuvers: Seated upright 90 degrees             General recommendations:  (Dietician consult - pt drinks Ensure at home per report) Oral Care Recommendations: Oral care BID;Staff/trained caregiver to provide oral care Follow up Recommendations: Skilled Nursing facility (TBD) Plan: Continue with current plan of care     GO               Jerilynn Som, MS, CCC-SLP  Tennelle Taflinger 08/03/2015, 11:49 AM

## 2015-08-03 NOTE — Progress Notes (Signed)
Electrolyte CONSULT NOTE - Follow Up   Pharmacy Consult for Electrolyte supplementation   No Known Allergies  Patient Measurements: Height: 6' (182.9 cm) Weight: 163 lb 4.8 oz (74.072 kg) IBW/kg (Calculated) : 77.6   Vital Signs: Temp: 98.5 F (36.9 C) (03/02 1151) Temp Source: Oral (03/02 1151) BP: 97/56 mmHg (03/02 1151) Pulse Rate: 61 (03/02 1319) Intake/Output from previous day: 03/01 0701 - 03/02 0700 In: 941.7 [I.V.:691.7; IV Piggyback:250] Out: -  Intake/Output from this shift:    Labs:  Recent Labs  08/01/15 1158 08/02/15 0101 08/02/15 0528 08/03/15 0501 08/03/15 1251  WBC 10.0 9.7 9.7 10.6  --   HGB 10.4* 9.2* 9.2* 9.4*  --   HCT 31.3* 28.0* 27.8* 28.1*  --   PLT 140* 130* 126* 127*  --   CREATININE 0.62 0.59* 0.64 1.23  --   MG  --  1.6*  --   --  1.7  PHOS  --  2.8  --   --   --   ALBUMIN 2.4*  --   --   --   --   PROT 6.5  --   --   --   --   AST 37  --   --   --   --   ALT 12*  --   --   --   --   ALKPHOS 66  --   --   --   --   BILITOT 1.4*  --   --   --   --    Estimated Creatinine Clearance: 46.9 mL/min (by C-G formula based on Cr of 1.23).  Medical History: Past Medical History  Diagnosis Date  . Hypertension   . Arthritis   . HLD (hyperlipidemia)   . Renal failure   . BPH (benign prostatic hyperplasia)    Medications:  Scheduled:  . enoxaparin (LOVENOX) injection  40 mg Subcutaneous QHS  . feeding supplement (ENSURE ENLIVE)  237 mL Oral TID WC  . magnesium sulfate 1 - 4 g bolus IVPB  2 g Intravenous Once  . mirtazapine  7.5 mg Oral QHS  . nystatin  5 mL Oral QID  . piperacillin-tazobactam (ZOSYN)  IV  3.375 g Intravenous 3 times per day  . potassium chloride  20 mEq Oral Once  . potassium chloride  10 mEq Intravenous Q1 Hr x 4  . sodium chloride flush  3 mL Intravenous Q12H  . tamsulosin  0.4 mg Oral QHS    Assessment: Pharmacy consulted to supplement electrolytes in an 82 yo male admitted with delirium.    K remains low  at 2.9 after receiving 5 runs of KCl today. Mg is slightly low at 1.7   Goal of Therapy:  K: 3.5-5.1 Mag: 1.7-2.4 Phos: 2.5-4.6  Plan:  Give magnesium 2 g IV x 1 dose Give KCl 20 mEq PO packet Give KCl 10 mEq IV x 4 runs  Will recheck K/Mg/Phos with AM labs tomorrow. Pharmacy will continue to follow, thank you for the consult.   Cindi Carbon, PharmD Clinical Pharmacist 08/03/2015,5:38 PM

## 2015-08-03 NOTE — Progress Notes (Signed)
Electrolyte CONSULT NOTE - INITIAL   Pharmacy Consult for Electrolyte supplementation   No Known Allergies  Patient Measurements: Height: 6' (182.9 cm) Weight: 163 lb 4.8 oz (74.072 kg) IBW/kg (Calculated) : 77.6   Vital Signs: Temp: 97.5 F (36.4 C) (03/02 0543) Temp Source: Oral (03/02 0543) BP: 92/62 mmHg (03/02 0543) Pulse Rate: 70 (03/02 0543) Intake/Output from previous day: 03/01 0701 - 03/02 0700 In: 941.7 [I.V.:691.7; IV Piggyback:250] Out: -  Intake/Output from this shift:    Labs:  Recent Labs  08/01/15 1158 08/02/15 0101 08/02/15 0528 08/03/15 0501  WBC 10.0 9.7 9.7 10.6  HGB 10.4* 9.2* 9.2* 9.4*  HCT 31.3* 28.0* 27.8* 28.1*  PLT 140* 130* 126* 127*  CREATININE 0.62 0.59* 0.64 1.23  MG  --  1.6*  --   --   PHOS  --  2.8  --   --   ALBUMIN 2.4*  --   --   --   PROT 6.5  --   --   --   AST 37  --   --   --   ALT 12*  --   --   --   ALKPHOS 66  --   --   --   BILITOT 1.4*  --   --   --    Estimated Creatinine Clearance: 46.9 mL/min (by C-G formula based on Cr of 1.23).   Microbiology: Recent Results (from the past 720 hour(s))  Blood culture (routine x 2)     Status: None   Collection Time: 07/12/15 11:34 PM  Result Value Ref Range Status   Specimen Description BLOOD BLOOD LEFT FOREARM  Final   Special Requests BOTTLES DRAWN AEROBIC AND ANAEROBIC  Final   Culture NO GROWTH 5 DAYS  Final   Report Status 07/18/2015 FINAL  Final  Blood culture (routine x 2)     Status: None   Collection Time: 07/12/15 11:34 PM  Result Value Ref Range Status   Specimen Description BLOOD LEFT ANTECUBITAL  Final   Special Requests BOTTLES DRAWN AEROBIC AND ANAEROBIC  Final   Culture NO GROWTH 5 DAYS  Final   Report Status 07/18/2015 FINAL  Final  Culture, blood (Routine X 2) w Reflex to ID Panel     Status: None (Preliminary result)   Collection Time: 08/02/15 12:55 AM  Result Value Ref Range Status   Specimen Description BLOOD LEFT ANTECUBITAL  Final    Special Requests BOTTLES DRAWN AEROBIC AND ANAEROBIC  Final   Culture NO GROWTH < 12 HOURS  Final   Report Status PENDING  Incomplete  Culture, blood (Routine X 2) w Reflex to ID Panel     Status: None (Preliminary result)   Collection Time: 08/02/15  1:01 AM  Result Value Ref Range Status   Specimen Description BLOOD LEFT HAND  Final   Special Requests BOTTLES DRAWN AEROBIC AND ANAEROBIC  Final   Culture NO GROWTH < 12 HOURS  Final   Report Status PENDING  Incomplete    Medical History: Past Medical History  Diagnosis Date  . Hypertension   . Arthritis   . HLD (hyperlipidemia)   . Renal failure   . BPH (benign prostatic hyperplasia)     Medications:  Scheduled:  . enoxaparin (LOVENOX) injection  40 mg Subcutaneous QHS  . feeding supplement (ENSURE ENLIVE)  237 mL Oral TID WC  . mirtazapine  7.5 mg Oral QHS  . nystatin  5 mL Oral QID  .  piperacillin-tazobactam (ZOSYN)  IV  3.375 g Intravenous 3 times per day  . potassium chloride  10 mEq Intravenous Q1 Hr x 5  . sodium chloride flush  3 mL Intravenous Q12H  . tamsulosin  0.4 mg Oral QHS  . vancomycin  1,000 mg Intravenous Q12H   Infusions:    Assessment: Pharmacy consulted to supplement electrolytes in an 82 yo male admitted with delirium.    K: 2.3, mag: pending, Calcium: 8.0, corrected Ca:9.28 based on albumin of 2.4, Phos: 2.8 (3/1)  Goal of Therapy:  K: 3.5-5.1 Mag: 1.7-2.4 Phos: 2.5-4.6  Plan:  Magnesium level pending.  MD ordered KCl 10 meq IV x 5 doses.  Will recheck level at 1300 to assess further requirements.    Pharmacy will continue to follow.   Atticus Wedin G 08/03/2015,11:30 AM

## 2015-08-03 NOTE — Care Management Important Message (Signed)
Important Message  Patient Details  Name: Edwin Sutton MRN: [id number] Date of Birth: December 15, 1928   Medicare Important Message Given:  Yes    Olegario Messier A Kadeen Sroka 08/03/2015, 10:15 AM

## 2015-08-03 NOTE — Progress Notes (Signed)
Dr. Juliene Pina notified of potassium of 2.9. Pharmacy consult requested per Dr. Juliene Pina.

## 2015-08-03 NOTE — Clinical Social Work Note (Signed)
Clinical Social Work Assessment  Patient Details  Name: Edwin Sutton MRN: [id number] Date of Birth: May 08, 1929  Date of referral:  08/03/15               Reason for consult:  Facility Placement                Permission sought to share information with:  Family Supports Permission granted to share information::  Yes, Verbal Permission Granted  Name::     Elster Corbello, son   Housing/Transportation Living arrangements for the past 2 months:  [place] of Information:  Patient, Adult Children Patient Interpreter Needed:  None Criminal Activity/Legal Involvement Pertinent to Current Situation/Hospitalization:  No - Comment as needed Significant Relationships:  Adult Children Lives with:  Self Do you feel safe going back to the place where you live?  Yes Need for family participation in patient care:  Yes (Comment)  Care giving concerns:  No care giving concerns identified.   Social Worker assessment / plan:  CSW met with pt and son to address consult for New SNF placement. PT has recommending STR at SNF at discharge. CSW introduced herself and explained role of social work. CSW also explained the process of discharging to SNF. Pt is aware of the process as he has been to SNF in the past and he is agreeable to this discharge plan. CSW initiated a SNF search. CSW will follow up with bed offers. CSW will continue to follow.   Employment status:  Retired Nurse, adult PT Recommendations:  [place] / Referral to community resources:  [place]  Patient/Family's Response to care:  Pt and son were appreciative of CSW support.   Patient/Family's Understanding of and Emotional Response to Diagnosis, Current Treatment, and Prognosis:  Pt is agreeable to discharge plan.   Emotional Assessment Appearance:  Appears stated age Attitude/Demeanor/Rapport:  Other (Appropriate) Affect (typically observed):   Pleasant Orientation:  Oriented to Self, Oriented to Place, Oriented to  Time, Oriented to Situation Alcohol / Substance use:  Never Used Psych involvement (Current and /or in the community):  No (Comment)  Discharge Needs  Concerns to be addressed:  Adjustment to Illness Readmission within the last 30 days:  No Current discharge risk:  None Barriers to Discharge:  Continued Medical Work up   [employer], LCSW 08/03/2015, 2:47 PM

## 2015-08-03 NOTE — Evaluation (Signed)
Physical Therapy Evaluation Patient Details Name: Edwin Sutton MRN: [id number] DOB: Sep 10, 1928 Today's Date: 08/03/2015   History of Present Illness  presented to ER secondary to worsening weakness, lethargy; admitted with persistent, worsening L PNA. Of note, patient with 2 previous admissions with past six months due to similar diagnosis.  Clinical Impression  Upon evaluation, patient alert and oriented to self; follows simple, one-step commands with increased time for processing.  L UE shoulder elevation limited due to pain (x-ray negative for acute injury); otherwise, bilat UE/LE grossly WFL for basic transfers and mobility (at least 3-/5)  Currently requiring mod assist for bed mobility; mod assist +2 for sit/stand, basic transfers and gait (5-6') with RW.  Very unsteady, requiring hands-on assist for walker management, weight shift and overall standing balance. Very high fall risk. Would benefit from skilled PT to address above deficits and promote optimal return to PLOF; recommend transition to STR upon discharge from acute hospitalization.     Follow Up Recommendations SNF    Equipment Recommendations       Recommendations for Other Services       Precautions / Restrictions Precautions Precautions: Fall Restrictions Weight Bearing Restrictions: No      Mobility  Bed Mobility Overal bed mobility: Needs Assistance Bed Mobility: Supine to Sit     Supine to sit: Mod assist        Transfers Overall transfer level: Needs assistance Equipment used: Rolling walker (2 wheeled) Transfers: Sit to/from Stand Sit to Stand: Mod assist;+2 physical assistance         General transfer comment: heavy posterior weight shift  Ambulation/Gait Ambulation/Gait assistance: Mod assist;+2 physical assistance Ambulation Distance (Feet): 6 Feet Assistive device: Rolling walker (2 wheeled)       General Gait Details: constant assist for walker management, weight shift and standing  balance; very unsteady; high fall risk  Stairs            Wheelchair Mobility    Modified Rankin (Stroke Patients Only)       Balance Overall balance assessment: Needs assistance Sitting-balance support: No upper extremity supported;Feet supported Sitting balance-Leahy Scale: Fair     Standing balance support: Bilateral upper extremity supported Standing balance-Leahy Scale: Poor                               Pertinent Vitals/Pain Pain Assessment: Faces Faces Pain Scale: Hurts little more Pain Location: L shoulder Pain Descriptors / Indicators: Aching;Grimacing;Sore Pain Intervention(s): Limited activity within patient's tolerance;Monitored during session;Repositioned    Home Living Family/patient expects to be discharged to:: Private residence Living Arrangements: Children Available Help at Discharge: Available 24 hours/day;Family Type of Home: House Home Access: Stairs to enter Entrance Stairs-Rails: Left Entrance Stairs-Number of Steps: 4 Home Layout: One level Home Equipment: Walker - 2 wheels;Cane - single point      Prior Function Level of Independence: Independent with assistive device(s)         Comments: Indep with ADLs and household activities, intermittent use of RW     Hand Dominance        Extremity/Trunk Assessment   Upper Extremity Assessment:  (L UE elevation limited to approx 45-50 degrees due to pain; otherwise, bilat UEs grossly WFL)           Lower Extremity Assessment: Generalized weakness (grossly 3-/5 throughout, bilat knee ext lacking approx 15-20 degrees from full extension)         Communication  Cognition Arousal/Alertness: Awake/alert Behavior During Therapy: WFL for tasks assessed/performed Overall Cognitive Status: Impaired/Different from baseline (oriented to basic information; pleasantly confused with delayed processing time.  Limited insight/awareness into deficits)                       General Comments      Exercises Other Exercises Other Exercises: Patient with incontinent bowel episode during session.  Additional sit/stand from recliner with RW, mod assist +2 for hygiene/clothing management; mod assist +1 for static standing balance with RW; dep of second person for hygiene.      Assessment/Plan    PT Assessment Patient needs continued PT services  PT Diagnosis Difficulty walking;Generalized weakness   PT Problem List Decreased strength;Decreased activity tolerance;Decreased mobility;Decreased balance;Cardiopulmonary status limiting activity;Decreased range of motion;Decreased cognition;Decreased knowledge of use of DME;Decreased safety awareness;Decreased knowledge of precautions  PT Treatment Interventions Gait training;Therapeutic exercise;Balance training;Stair training;Functional mobility training;Therapeutic activities;Patient/family education;DME instruction   PT Goals (Current goals can be found in the Care Plan section) Acute Rehab PT Goals PT Goal Formulation: Patient unable to participate in goal setting Time For Goal Achievement: 08/27/2015 Potential to Achieve Goals: Good    Frequency Min 2X/week   Barriers to discharge        Co-evaluation               End of Session Equipment Utilized During Treatment: Gait belt Activity Tolerance: Patient tolerated treatment well Patient left: in chair;with call bell/phone within reach;with chair alarm set Nurse Communication: Mobility status         Time: 0981-1914 PT Time Calculation (min) (ACUTE ONLY): 24 min   Charges:   PT Evaluation $PT Eval Low Complexity: 1 Procedure PT Treatments $Therapeutic Activity: 8-22 mins   PT G Codes:        Esterlene Atiyeh H. Manson Passey, PT, DPT, NCS 08/03/2015, 1:28 PM [phone]

## 2015-08-03 NOTE — Progress Notes (Signed)
Electrolyte CONSULT NOTE - Follow Up   Pharmacy Consult for Electrolyte supplementation   No Known Allergies  Patient Measurements: Height: 6' (182.9 cm) Weight: 163 lb 4.8 oz (74.072 kg) IBW/kg (Calculated) : 77.6   Vital Signs: Temp: 98.5 F (36.9 C) (03/02 1151) Temp Source: Oral (03/02 1151) BP: 97/56 mmHg (03/02 1151) Pulse Rate: 61 (03/02 1319) Intake/Output from previous day: 03/01 0701 - 03/02 0700 In: 941.7 [I.V.:691.7; IV Piggyback:250] Out: -  Intake/Output from this shift:    Labs:  Recent Labs  08/01/15 1158 08/02/15 0101 08/02/15 0528 08/03/15 0501 08/03/15 1251  WBC 10.0 9.7 9.7 10.6  --   HGB 10.4* 9.2* 9.2* 9.4*  --   HCT 31.3* 28.0* 27.8* 28.1*  --   PLT 140* 130* 126* 127*  --   CREATININE 0.62 0.59* 0.64 1.23  --   MG  --  1.6*  --   --  1.7  PHOS  --  2.8  --   --   --   ALBUMIN 2.4*  --   --   --   --   PROT 6.5  --   --   --   --   AST 37  --   --   --   --   ALT 12*  --   --   --   --   ALKPHOS 66  --   --   --   --   BILITOT 1.4*  --   --   --   --    Estimated Creatinine Clearance: 46.9 mL/min (by C-G formula based on Cr of 1.23).   Microbiology: Recent Results (from the past 720 hour(s))  Blood culture (routine x 2)     Status: None   Collection Time: 07/12/15 11:34 PM  Result Value Ref Range Status   Specimen Description BLOOD BLOOD LEFT FOREARM  Final   Special Requests BOTTLES DRAWN AEROBIC AND ANAEROBIC  Final   Culture NO GROWTH 5 DAYS  Final   Report Status 07/18/2015 FINAL  Final  Blood culture (routine x 2)     Status: None   Collection Time: 07/12/15 11:34 PM  Result Value Ref Range Status   Specimen Description BLOOD LEFT ANTECUBITAL  Final   Special Requests BOTTLES DRAWN AEROBIC AND ANAEROBIC  Final   Culture NO GROWTH 5 DAYS  Final   Report Status 07/18/2015 FINAL  Final  Culture, blood (Routine X 2) w Reflex to ID Panel     Status: None (Preliminary result)   Collection Time: 08/02/15 12:55 AM   Result Value Ref Range Status   Specimen Description BLOOD LEFT ANTECUBITAL  Final   Special Requests BOTTLES DRAWN AEROBIC AND ANAEROBIC  Final   Culture NO GROWTH < 12 HOURS  Final   Report Status PENDING  Incomplete  Culture, blood (Routine X 2) w Reflex to ID Panel     Status: None (Preliminary result)   Collection Time: 08/02/15  1:01 AM  Result Value Ref Range Status   Specimen Description BLOOD LEFT HAND  Final   Special Requests BOTTLES DRAWN AEROBIC AND ANAEROBIC  Final   Culture NO GROWTH < 12 HOURS  Final   Report Status PENDING  Incomplete    Medical History: Past Medical History  Diagnosis Date  . Hypertension   . Arthritis   . HLD (hyperlipidemia)   . Renal failure   . BPH (benign prostatic hyperplasia)     Medications:  Scheduled:  .  enoxaparin (LOVENOX) injection  40 mg Subcutaneous QHS  . feeding supplement (ENSURE ENLIVE)  237 mL Oral TID WC  . mirtazapine  7.5 mg Oral QHS  . nystatin  5 mL Oral QID  . piperacillin-tazobactam (ZOSYN)  IV  3.375 g Intravenous 3 times per day  . sodium chloride flush  3 mL Intravenous Q12H  . tamsulosin  0.4 mg Oral QHS   Infusions:    Assessment: Pharmacy consulted to supplement electrolytes in an 82 yo male admitted with delirium.    K: 2.3, mag: 1.7, Calcium: 8.0, corrected Ca:9.28 based on albumin of 2.4, Phos: 2.8 (3/1)  Follow Up K at 1251 of 2.7-Per RN note recheck at 1620 as potassium not finished infusing.    Goal of Therapy:  K: 3.5-5.1 Mag: 1.7-2.4 Phos: 2.5-4.6  Plan:  Follow up with level at 1620 to evaluate need for further supplementation.   Pharmacy will continue to follow.   Kaylee Wombles G 08/03/2015,2:31 PM

## 2015-08-03 NOTE — Progress Notes (Signed)
Dr. Juliene Pina notified of potassium of 2.7. Potassium level drawn before potassium replacement bags were finished infusing. Re-draw potassium 2 hours after potassium replacement complete per Dr. Juliene Pina.   Ron Parker, RN

## 2015-08-04 LAB — PHOSPHORUS: PHOSPHORUS: 3 mg/dL (ref 2.5–4.6)

## 2015-08-04 LAB — BASIC METABOLIC PANEL
ANION GAP: 9 (ref 5–15)
BUN: 14 mg/dL (ref 6–20)
CHLORIDE: 109 mmol/L (ref 101–111)
CO2: 25 mmol/L (ref 22–32)
CREATININE: 1.23 mg/dL (ref 0.61–1.24)
Calcium: 8.5 mg/dL — ABNORMAL LOW (ref 8.9–10.3)
GFR calc non Af Amer: 52 mL/min — ABNORMAL LOW (ref >60)
Glucose, Bld: 83 mg/dL (ref 65–99)
POTASSIUM: 3.4 mmol/L — AB (ref 3.5–5.1)
Sodium: 143 mmol/L (ref 135–145)

## 2015-08-04 LAB — VANCOMYCIN, TROUGH: Vancomycin Tr: 20 ug/mL (ref 10–20)

## 2015-08-04 LAB — MAGNESIUM: MAGNESIUM: 2 mg/dL (ref 1.7–2.4)

## 2015-08-04 MED ORDER — POTASSIUM CHLORIDE 20 MEQ PO PACK
40.0000 meq | PACK | Freq: Once | ORAL | Status: AC
  Administered 2015-08-04: 40 meq via ORAL
  Filled 2015-08-04: qty 2

## 2015-08-04 MED ORDER — DOXYCYCLINE HYCLATE 100 MG PO CAPS: 100.0000 mg | ORAL_CAPSULE | Freq: Two times a day (BID) | ORAL | Status: DC

## 2015-08-04 MED ORDER — AMOXICILLIN-POT CLAVULANATE 875-125 MG PO TABS: 1.0000 | ORAL_TABLET | Freq: Two times a day (BID) | ORAL | Status: DC

## 2015-08-04 NOTE — Progress Notes (Signed)
Pt d/c to snf per orders via EMS, family present

## 2015-08-04 NOTE — Progress Notes (Signed)
Electrolyte CONSULT NOTE - Follow Up   Pharmacy Consult for Electrolyte supplementation   No Known Allergies  Patient Measurements: Height: 6' (182.9 cm) Weight: 163 lb 4.8 oz (74.072 kg) IBW/kg (Calculated) : 77.6   Vital Signs: Temp: 98.5 F (36.9 C) (03/03 0501) Temp Source: Oral (03/03 0501) BP: 94/53 mmHg (03/03 0501) Pulse Rate: 71 (03/03 0501) Intake/Output from previous day:   Intake/Output from this shift: Total I/O In: 120 [P.O.:120] Out: -   Labs:  Recent Labs  08/01/15 1158 08/02/15 0101 08/02/15 0528 08/03/15 0501 08/03/15 1251 08/04/15 0532  WBC 10.0 9.7 9.7 10.6  --   --   HGB 10.4* 9.2* 9.2* 9.4*  --   --   HCT 31.3* 28.0* 27.8* 28.1*  --   --   PLT 140* 130* 126* 127*  --   --   CREATININE 0.62 0.59* 0.64 1.23  --  1.23  MG  --  1.6*  --   --  1.7 2.0  PHOS  --  2.8  --   --   --  3.0  ALBUMIN 2.4*  --   --   --   --   --   PROT 6.5  --   --   --   --   --   AST 37  --   --   --   --   --   ALT 12*  --   --   --   --   --   ALKPHOS 66  --   --   --   --   --   BILITOT 1.4*  --   --   --   --   --    Estimated Creatinine Clearance: 46.9 mL/min (by C-G formula based on Cr of 1.23).  Medical History: Past Medical History  Diagnosis Date  . Hypertension   . Arthritis   . HLD (hyperlipidemia)   . Renal failure   . BPH (benign prostatic hyperplasia)    Medications:  Scheduled:  . enoxaparin (LOVENOX) injection  40 mg Subcutaneous QHS  . feeding supplement (ENSURE ENLIVE)  237 mL Oral TID WC  . mirtazapine  7.5 mg Oral QHS  . nystatin  5 mL Oral QID  . piperacillin-tazobactam (ZOSYN)  IV  3.375 g Intravenous 3 times per day  . potassium chloride  40 mEq Oral Once  . sodium chloride flush  3 mL Intravenous Q12H  . tamsulosin  0.4 mg Oral QHS    Assessment: Pharmacy consulted to supplement electrolytes in an 82 yo male admitted with delirium.     Goal of Therapy:  K: 3.5-5.1 Mag: 1.7-2.4 Phos: 2.5-4.6  Plan:  Will order  potassium 40mEq PO x 1.   Will recheck electrolytes with am labs.   Pharmacy will continue to monitor and adjust per consult.   Simpson,Michael L, PharmD Clinical Pharmacist 08/04/2015,10:18 AM

## 2015-08-04 NOTE — Progress Notes (Addendum)
Clinical Social Worker informed by Edwin SaranSital Mody, MD that patient is medically ready to discharge to SNF, Patient and  Daughter [name] are in a agreement with plan.  Call to [facility] to confirm that patient's bed is ready. Provided patient's room number 9-B and number to call for report [phone] . All discharge information faxed to  Facility. No Rx's for discharge packet.   RN will call report and [facility].  Insurance auth received from [facility]  #[id number]  [name] LCSWA Clinical Social Work Department [phone] 11:15 AM

## 2015-08-04 NOTE — Clinical Social Work Placement (Signed)
   CLINICAL SOCIAL WORK PLACEMENT  NOTE  Date:  08/04/2015  Patient Details  Name: Edwin Sutton MRN: [id number] Date of Birth: 07/14/1928  Clinical Social Work is seeking post-discharge placement for this patient at the Skilled  Nursing Facility level of care (*CSW will initial, date and re-position this form in  chart as items are completed):  Yes   Patient/family provided with [place] Clinical Social Work Department's list of facilities offering this level of care within the geographic area requested by the patient (or if unable, by the patient's family).  Yes   Patient/family informed of their freedom to choose among providers that offer the needed level of care, that participate in Medicare, Medicaid or managed care program needed by the patient, have an available bed and are willing to accept the patient.  Yes   Patient/family informed of [place]'s ownership interest in [facility] and [facility], as well as of the fact that they are under no obligation to receive care at these facilities.  PASRR submitted to EDS on       PASRR number received on       Existing PASRR number confirmed on 08/03/15     FL2 transmitted to all facilities in geographic area requested by pt/family on 08/03/15     FL2 transmitted to all facilities within larger geographic area on       Patient informed that his/her managed care company has contracts with or will negotiate with certain facilities, including the following:        Yes   Patient/family informed of bed offers received.  Patient chooses bed at [facility]     Physician recommends and patient chooses bed at      Patient to be transferred to [facility] on 08/04/15.  Patient to be transferred to facility by EMS     Patient family notified on 08/04/15 of transfer.  Name of family member notified:  Daughter Darlene      PHYSICIAN       Additional Comment:     _______________________________________________ Soundra PilonMoore, Deborah H, LCSW 08/04/2015, 11:13 AM

## 2015-08-04 NOTE — Discharge Summary (Addendum)
[facility] [place] [facility]   PATIENT NAME: Edwin Sutton    MR#:  [id number]  DATE OF BIRTH:  1928/09/30  DATE OF ADMISSION:  08/01/2015 ADMITTING PHYSICIAN: Oralia Manis, MD  DATE OF DISCHARGE: 08/04/2015  PRIMARY CARE PHYSICIAN: Rafael Bihari, MD    ADMISSION DIAGNOSIS:  Weakness [R53.1] Community acquired pneumonia [J18.9] Elevated troponin [R79.89] Elevated lactic acid level [E87.2]  DISCHARGE DIAGNOSIS:  Principal Problem:   HCAP (healthcare-associated pneumonia) Active Problems:   Acute delirium   Benign essential HTN   Paroxysmal atrial fibrillation (HCC)   BPH (benign prostatic hyperplasia)   SECONDARY DIAGNOSIS:   Past Medical History  Diagnosis Date  . Hypertension   . Arthritis   . HLD (hyperlipidemia)   . Renal failure   . BPH (benign prostatic hyperplasia)     HOSPITAL COURSE:    82 year old male with history of BPH who presented with weakness and encephalopathy.   1. Acute metabolic encephalopathy due to healthcare associated pneumonia: Encephalopathy has resolved. Patient is at his baseline.  2. Healthcare associated pneumonia, left lower lobe consolidation: Lactic acid has improved. Blood cultures are negative and sputum culture has been sent. He was on vancomycin and Zosyn. He was switched to oral antibiotics at discharge, augmentin and doxy. There was no overt signs or symptoms of aspiration as per speech pathology consultation. She did recommend dysphagia 2 diet with aspiration precautions however.   3. BPH: Continue Flomax.   4. Severe hypokalemia: repleted.  5. Elevated troponin: This due to demand ischemia and not ACS.    DISCHARGE CONDITIONS AND DIET:   Stable for discharge on a dysphagia 2 finely chopped thin liquid diet with aspiration precautions   CONSULTS OBTAINED:     DRUG ALLERGIES:  No Known Allergies  DISCHARGE MEDICATIONS:   Current Discharge Medication List    START  taking these medications   Details  amoxicillin-clavulanate (AUGMENTIN) 875-125 MG tablet Take 1 tablet by mouth 2 (two) times daily. Qty: 16 tablet, Refills: 0    doxycycline (VIBRAMYCIN) 100 MG capsule Take 1 capsule (100 mg total) by mouth 2 (two) times daily. Qty: 20 capsule, Refills: 0      CONTINUE these medications which have NOT CHANGED   Details  mirtazapine (REMERON) 7.5 MG tablet Take 7.5 mg by mouth at bedtime.    acetaminophen (TYLENOL) 500 MG tablet Take 500-1,000 mg by mouth every 6 (six) hours as needed for mild pain or headache. Reported on 07/25/2015    !! feeding supplement, ENSURE ENLIVE, (ENSURE ENLIVE) LIQD Take 237 mLs by mouth 3 (three) times daily with meals. Qty: 237 mL, Refills: 12    !! feeding supplement, ENSURE ENLIVE, (ENSURE ENLIVE) LIQD Take 237 mLs by mouth 2 (two) times daily between meals. Qty: 237 mL, Refills: 12    tamsulosin (FLOMAX) 0.4 MG CAPS capsule Take 1 capsule (0.4 mg total) by mouth at bedtime. Qty: 90 capsule, Refills: 3   Associated Diagnoses: Urinary retention     !! - Potential duplicate medications found. Please discuss with provider.    STOP taking these medications     levofloxacin (LEVAQUIN) 750 MG tablet      naproxen sodium (ANAPROX) 220 MG tablet               Today   CHIEF COMPLAINT:  No acute issues overnight. Patient is doing well.   VITAL SIGNS:  Blood pressure 94/53, pulse 71, temperature 98.5 F (36.9 C), temperature source Oral, resp.  rate 20, height 6' (1.829 m), weight 74.072 kg (163 lb 4.8 oz), SpO2 99 %.   REVIEW OF SYSTEMS:  Review of Systems  Constitutional: Negative for fever, chills and malaise/fatigue.  HENT: Negative for sore throat.   Eyes: Negative for blurred vision.  Respiratory: Negative for cough, hemoptysis, shortness of breath and wheezing.   Cardiovascular: Negative for chest pain, palpitations and leg swelling.  Gastrointestinal: Negative for nausea, vomiting, abdominal  pain, diarrhea and blood in stool.  Genitourinary: Negative for dysuria.  Musculoskeletal: Negative for back pain.  Neurological: Negative for dizziness, tremors and headaches.  Endo/Heme/Allergies: Does not bruise/bleed easily.     PHYSICAL EXAMINATION:  GENERAL:  82 y.o.-year-old patient lying in the bed with no acute distress.  NECK:  Supple, no jugular venous distention. No thyroid enlargement, no tenderness.  LUNGS: Normal breath sounds bilaterally, no wheezing, rales,rhonchi  No use of accessory muscles of respiration.  CARDIOVASCULAR: S1, S2 normal. No murmurs, rubs, or gallops.  ABDOMEN: Soft, non-tender, non-distended. Bowel sounds present. No organomegaly or mass.  EXTREMITIES: No pedal edema, cyanosis, or clubbing.  PSYCHIATRIC: The patient is alert and oriented x to name and place  SKIN: No obvious rash, lesion, or ulcer.   DATA REVIEW:   CBC  Recent Labs Lab 08/03/15 0501  WBC 10.6  HGB 9.4*  HCT 28.1*  PLT 127*    Chemistries   Recent Labs Lab 08/01/15 1158  08/04/15 0532  NA 142  < > 143  K 3.1*  < > 3.4*  CL 103  < > 109  CO2 26  < > 25  GLUCOSE 132*  < > 83  BUN 11  < > 14  CREATININE 0.62  < > 1.23  CALCIUM 8.8*  < > 8.5*  MG  --   < > 2.0  AST 37  --   --   ALT 12*  --   --   ALKPHOS 66  --   --   BILITOT 1.4*  --   --   < > = values in this interval not displayed.  Cardiac Enzymes  Recent Labs Lab 08/02/15 0101 08/02/15 0528 08/02/15 1146  TROPONINI 0.05* 0.04* 0.04*    Microbiology Results  @MICRORSLT48 @  RADIOLOGY:  No results found.    Management plans discussed with the patient's brother and he is in agreement. Stable for discharge   Patient should follow up with PCP in 1 week  CODE STATUS:     Code Status Orders        Start     Ordered   08/01/15 2348  Full code   Continuous     08/01/15 2347    Code Status History    Date Active Date Inactive Code Status Order ID Comments User Context   07/13/2015 12:50  AM 07/14/2015  3:44 PM Full Code [id number]  Milagros LollSrikar Sudini, MD ED   06/19/2015  5:00 PM 06/26/2015  4:26 PM Full Code [id number]  Katha HammingSnehalatha Konidena, MD ED      TOTAL TIME TAKING CARE OF THIS PATIENT: 35 minutes.    Note: This dictation was prepared with Dragon dictation along with smaller phrase technology. Any transcriptional errors that result from this process are unintentional.  Margaretta Chittum M.D on 08/04/2015 at 10:18 AM  Between 7am to 6pm - Pager - [phone] After 6pm go to [email or web address] - password [email or web address] [facility]  [facility]  Office  [phone]  CC: Primary care physician; Rafael BihariWALKER III, JOHN B, MD

## 2015-08-04 NOTE — Discharge Instructions (Signed)

## 2015-08-04 NOTE — Progress Notes (Signed)
[facility]  Daughter present in room with pt, informed of pts d/c to SNF, voiced understanding, EMS called for transport.

## 2015-08-07 LAB — CULTURE, BLOOD (ROUTINE X 2)
CULTURE: NO GROWTH
CULTURE: NO GROWTH

## 2015-08-08 ENCOUNTER — Other Ambulatory Visit: Payer: Self-pay | Admitting: *Deleted

## 2015-08-08 DIAGNOSIS — J189 Pneumonia, unspecified organism: Secondary | ICD-10-CM

## 2015-08-08 NOTE — Patient Outreach (Signed)
[place] in [place], [facility] for failure to thrive, recurrent PNA, then discharged to [facility] 3/3.  This RN CM was following pt for transition of care for  prior hospitalization (discharged 2/10).  Referral for [facility]  CSW sent to follow pt while at SNF and this current RN CM to f/u at discharge from SNF.     Shayne Alkenose M.   Pierzchala RN CCM [facility]  [phone]

## 2015-08-13 ENCOUNTER — Inpatient Hospital Stay
Admission: EM | Admit: 2015-08-13 | Discharge: 2015-09-02 | DRG: 871 | Disposition: E | Payer: Commercial Managed Care - HMO | Attending: Internal Medicine | Admitting: Internal Medicine

## 2015-08-13 ENCOUNTER — Emergency Department: Payer: Commercial Managed Care - HMO

## 2015-08-13 DIAGNOSIS — Z79899 Other long term (current) drug therapy: Secondary | ICD-10-CM

## 2015-08-13 DIAGNOSIS — N401 Enlarged prostate with lower urinary tract symptoms: Secondary | ICD-10-CM | POA: Diagnosis present

## 2015-08-13 DIAGNOSIS — J9819 Other pulmonary collapse: Secondary | ICD-10-CM | POA: Diagnosis not present

## 2015-08-13 DIAGNOSIS — J9600 Acute respiratory failure, unspecified whether with hypoxia or hypercapnia: Secondary | ICD-10-CM | POA: Diagnosis not present

## 2015-08-13 DIAGNOSIS — G9341 Metabolic encephalopathy: Secondary | ICD-10-CM | POA: Diagnosis present

## 2015-08-13 DIAGNOSIS — E785 Hyperlipidemia, unspecified: Secondary | ICD-10-CM | POA: Diagnosis present

## 2015-08-13 DIAGNOSIS — E872 Acidosis, unspecified: Secondary | ICD-10-CM

## 2015-08-13 DIAGNOSIS — I480 Paroxysmal atrial fibrillation: Secondary | ICD-10-CM | POA: Diagnosis present

## 2015-08-13 DIAGNOSIS — R627 Adult failure to thrive: Secondary | ICD-10-CM | POA: Diagnosis present

## 2015-08-13 DIAGNOSIS — Z6824 Body mass index (BMI) 24.0-24.9, adult: Secondary | ICD-10-CM | POA: Diagnosis not present

## 2015-08-13 DIAGNOSIS — F0390 Unspecified dementia without behavioral disturbance: Secondary | ICD-10-CM | POA: Diagnosis present

## 2015-08-13 DIAGNOSIS — A419 Sepsis, unspecified organism: Secondary | ICD-10-CM | POA: Diagnosis present

## 2015-08-13 DIAGNOSIS — J9811 Atelectasis: Secondary | ICD-10-CM | POA: Diagnosis present

## 2015-08-13 DIAGNOSIS — R0600 Dyspnea, unspecified: Secondary | ICD-10-CM | POA: Insufficient documentation

## 2015-08-13 DIAGNOSIS — J90 Pleural effusion, not elsewhere classified: Secondary | ICD-10-CM | POA: Diagnosis present

## 2015-08-13 DIAGNOSIS — J9601 Acute respiratory failure with hypoxia: Secondary | ICD-10-CM | POA: Diagnosis present

## 2015-08-13 DIAGNOSIS — E876 Hypokalemia: Secondary | ICD-10-CM | POA: Diagnosis present

## 2015-08-13 DIAGNOSIS — Z66 Do not resuscitate: Secondary | ICD-10-CM | POA: Diagnosis present

## 2015-08-13 DIAGNOSIS — E870 Hyperosmolality and hypernatremia: Secondary | ICD-10-CM | POA: Diagnosis present

## 2015-08-13 DIAGNOSIS — L89150 Pressure ulcer of sacral region, unstageable: Secondary | ICD-10-CM | POA: Diagnosis present

## 2015-08-13 DIAGNOSIS — D638 Anemia in other chronic diseases classified elsewhere: Secondary | ICD-10-CM | POA: Diagnosis present

## 2015-08-13 DIAGNOSIS — D696 Thrombocytopenia, unspecified: Secondary | ICD-10-CM | POA: Diagnosis present

## 2015-08-13 DIAGNOSIS — Z515 Encounter for palliative care: Secondary | ICD-10-CM | POA: Diagnosis not present

## 2015-08-13 DIAGNOSIS — Z87891 Personal history of nicotine dependence: Secondary | ICD-10-CM

## 2015-08-13 DIAGNOSIS — R338 Other retention of urine: Secondary | ICD-10-CM | POA: Diagnosis present

## 2015-08-13 DIAGNOSIS — R64 Cachexia: Secondary | ICD-10-CM | POA: Diagnosis present

## 2015-08-13 DIAGNOSIS — Y95 Nosocomial condition: Secondary | ICD-10-CM | POA: Diagnosis present

## 2015-08-13 DIAGNOSIS — M1990 Unspecified osteoarthritis, unspecified site: Secondary | ICD-10-CM | POA: Diagnosis present

## 2015-08-13 DIAGNOSIS — Z96643 Presence of artificial hip joint, bilateral: Secondary | ICD-10-CM | POA: Diagnosis present

## 2015-08-13 DIAGNOSIS — I248 Other forms of acute ischemic heart disease: Secondary | ICD-10-CM | POA: Diagnosis present

## 2015-08-13 DIAGNOSIS — Z8673 Personal history of transient ischemic attack (TIA), and cerebral infarction without residual deficits: Secondary | ICD-10-CM

## 2015-08-13 DIAGNOSIS — R1310 Dysphagia, unspecified: Secondary | ICD-10-CM | POA: Diagnosis present

## 2015-08-13 DIAGNOSIS — E43 Unspecified severe protein-calorie malnutrition: Secondary | ICD-10-CM | POA: Diagnosis present

## 2015-08-13 DIAGNOSIS — E860 Dehydration: Secondary | ICD-10-CM | POA: Diagnosis present

## 2015-08-13 DIAGNOSIS — R339 Retention of urine, unspecified: Secondary | ICD-10-CM

## 2015-08-13 DIAGNOSIS — N400 Enlarged prostate without lower urinary tract symptoms: Secondary | ICD-10-CM | POA: Diagnosis present

## 2015-08-13 DIAGNOSIS — J189 Pneumonia, unspecified organism: Secondary | ICD-10-CM | POA: Diagnosis present

## 2015-08-13 DIAGNOSIS — Z8701 Personal history of pneumonia (recurrent): Secondary | ICD-10-CM | POA: Diagnosis not present

## 2015-08-13 DIAGNOSIS — J9690 Respiratory failure, unspecified, unspecified whether with hypoxia or hypercapnia: Secondary | ICD-10-CM

## 2015-08-13 DIAGNOSIS — N179 Acute kidney failure, unspecified: Secondary | ICD-10-CM | POA: Diagnosis present

## 2015-08-13 DIAGNOSIS — I10 Essential (primary) hypertension: Secondary | ICD-10-CM | POA: Diagnosis present

## 2015-08-13 LAB — CBC WITH DIFFERENTIAL/PLATELET
BASOS ABS: 0 10*3/uL (ref 0–0.1)
Basophils Relative: 0 %
EOS PCT: 0 %
Eosinophils Absolute: 0 10*3/uL (ref 0–0.7)
HEMATOCRIT: 30.8 % — AB (ref 40.0–52.0)
Hemoglobin: 9.8 g/dL — ABNORMAL LOW (ref 13.0–18.0)
LYMPHS ABS: 0.8 10*3/uL — AB (ref 1.0–3.6)
LYMPHS PCT: 6 %
MCH: 30.1 pg (ref 26.0–34.0)
MCHC: 31.9 g/dL — ABNORMAL LOW (ref 32.0–36.0)
MCV: 94.3 fL (ref 80.0–100.0)
Monocytes Absolute: 0.6 10*3/uL (ref 0.2–1.0)
Monocytes Relative: 4 %
NEUTROS ABS: 12.1 10*3/uL — AB (ref 1.4–6.5)
Neutrophils Relative %: 90 %
Platelets: 112 10*3/uL — ABNORMAL LOW (ref 150–440)
RBC: 3.26 MIL/uL — AB (ref 4.40–5.90)
RDW: 16.3 % — ABNORMAL HIGH (ref 11.5–14.5)
WBC: 13.5 10*3/uL — AB (ref 3.8–10.6)

## 2015-08-13 LAB — BLOOD GAS, VENOUS
ACID-BASE EXCESS: 2.8 mmol/L (ref 0.0–3.0)
BICARBONATE: 27.2 meq/L (ref 21.0–28.0)
Delivery systems: POSITIVE
FIO2: 0.4
O2 SAT: 94 %
PH VEN: 7.44 — AB (ref 7.320–7.430)
Patient temperature: 37
pCO2, Ven: 40 mmHg — ABNORMAL LOW (ref 44.0–60.0)
pO2, Ven: 68 mmHg — ABNORMAL HIGH (ref 31.0–45.0)

## 2015-08-13 LAB — URINALYSIS COMPLETE WITH MICROSCOPIC (ARMC ONLY)
BACTERIA UA: NONE SEEN
Bilirubin Urine: NEGATIVE
GLUCOSE, UA: NEGATIVE mg/dL
Ketones, ur: NEGATIVE mg/dL
LEUKOCYTES UA: NEGATIVE
Nitrite: NEGATIVE
PH: 6 (ref 5.0–8.0)
PROTEIN: NEGATIVE mg/dL
SPECIFIC GRAVITY, URINE: 1.011 (ref 1.005–1.030)
SQUAMOUS EPITHELIAL / LPF: NONE SEEN

## 2015-08-13 LAB — COMPREHENSIVE METABOLIC PANEL
ALT: 9 U/L — AB (ref 17–63)
AST: 33 U/L (ref 15–41)
Albumin: 2.1 g/dL — ABNORMAL LOW (ref 3.5–5.0)
Alkaline Phosphatase: 54 U/L (ref 38–126)
Anion gap: 5 (ref 5–15)
BUN: 17 mg/dL (ref 6–20)
CHLORIDE: 118 mmol/L — AB (ref 101–111)
CO2: 28 mmol/L (ref 22–32)
CREATININE: 1.16 mg/dL (ref 0.61–1.24)
Calcium: 9.4 mg/dL (ref 8.9–10.3)
GFR, EST NON AFRICAN AMERICAN: 56 mL/min — AB (ref >60)
Glucose, Bld: 110 mg/dL — ABNORMAL HIGH (ref 65–99)
POTASSIUM: 3.2 mmol/L — AB (ref 3.5–5.1)
SODIUM: 151 mmol/L — AB (ref 135–145)
Total Bilirubin: 0.8 mg/dL (ref 0.3–1.2)
Total Protein: 6.3 g/dL — ABNORMAL LOW (ref 6.5–8.1)

## 2015-08-13 LAB — BRAIN NATRIURETIC PEPTIDE: B NATRIURETIC PEPTIDE 5: 99 pg/mL (ref 0.0–100.0)

## 2015-08-13 LAB — TROPONIN I: TROPONIN I: 0.04 ng/mL — AB (ref <0.031)

## 2015-08-13 LAB — LACTIC ACID, PLASMA: LACTIC ACID, VENOUS: 3.1 mmol/L — AB (ref 0.5–2.0)

## 2015-08-13 MED ORDER — VANCOMYCIN HCL IN DEXTROSE 1-5 GM/200ML-% IV SOLN
1000.0000 mg | Freq: Once | INTRAVENOUS | Status: AC
  Administered 2015-08-13: 1000 mg via INTRAVENOUS
  Filled 2015-08-13: qty 200

## 2015-08-13 MED ORDER — PIPERACILLIN-TAZOBACTAM 3.375 G IVPB 30 MIN
3.3750 g | Freq: Once | INTRAVENOUS | Status: AC
  Administered 2015-08-13: 3.375 g via INTRAVENOUS
  Filled 2015-08-13: qty 50

## 2015-08-13 MED ORDER — LEVOFLOXACIN IN D5W 750 MG/150ML IV SOLN
750.0000 mg | Freq: Once | INTRAVENOUS | Status: AC
  Administered 2015-08-14: 750 mg via INTRAVENOUS
  Filled 2015-08-13: qty 150

## 2015-08-13 MED ORDER — VANCOMYCIN HCL IN DEXTROSE 1-5 GM/200ML-% IV SOLN
1000.0000 mg | INTRAVENOUS | Status: DC
  Administered 2015-08-14: 1000 mg via INTRAVENOUS
  Filled 2015-08-13 (×2): qty 200

## 2015-08-13 MED ORDER — PIPERACILLIN-TAZOBACTAM 4.5 G IVPB
4.5000 g | Freq: Three times a day (TID) | INTRAVENOUS | Status: DC
  Administered 2015-08-14: 4.5 g via INTRAVENOUS
  Filled 2015-08-13 (×3): qty 100

## 2015-08-13 MED ORDER — SODIUM CHLORIDE 0.9 % IV SOLN
Freq: Once | INTRAVENOUS | Status: AC
  Administered 2015-08-13: 22:00:00 via INTRAVENOUS

## 2015-08-13 MED ORDER — SODIUM CHLORIDE 0.9 % IV SOLN
Freq: Once | INTRAVENOUS | Status: AC
  Administered 2015-08-14: 01:00:00 via INTRAVENOUS

## 2015-08-13 NOTE — Progress Notes (Addendum)
Pharmacy Antibiotic Note  Edwin Sutton is a 82 y.o. male admitted on 16-Aug-2015 with sepsis.  Pharmacy has been consulted for vancomycin and Zosyn dosing.  Plan: DW 71.5kg  Vd 50L kei 0.043 hr-1  t1/2 16 hours Vancomycin 1 gram q 18 hours ordered with stacked dosing. Level before 5th dose, Goal 15-20.  Zosyn 4.5 grams q 8 hours ordered for Pseudomonas risk of recent treatment with abx.  Height: 5\' 8"  (172.7 cm) Weight: 157 lb 10.1 oz (71.5 kg) IBW/kg (Calculated) : 68.4  Temp (24hrs), Avg:99.9 F (37.7 C), Min:99.9 F (37.7 C), Max:99.9 F (37.7 C)   Recent Labs Lab 09/10/15 2121 09/10/15 2124  WBC  --  13.5*  CREATININE  --  1.16  LATICACIDVEN 3.1*  --     Estimated Creatinine Clearance: 45.9 mL/min (by C-G formula based on Cr of 1.16).    No Known Allergies  Antimicrobials this admission: Vancomycin, Zosyn >>    >>   Dose adjustments this admission:   Microbiology results: 3/12 BCx: pending 3/12  UCx: pending  3/12 Sputum: pening   CXR: L side opacity UA: (-)  Thank you for allowing pharmacy to be a part of this patient'Sutton care.  Edwin Sutton 16-Aug-2015 10:54 PM

## 2015-08-13 NOTE — H&P (Signed)
[facility] [place] [facility]   PATIENT NAME: Edwin Sutton    MR#:  [id number]  DATE OF BIRTH:  February 28, 1929  DATE OF ADMISSION:  2015-09-08  PRIMARY CARE PHYSICIAN: Rafael Bihari, Edwin Sutton   REQUESTING/REFERRING PHYSICIAN: Mayford Knife, M.D.  CHIEF COMPLAINT:   Chief Complaint  Patient presents with  . Respiratory Distress    HISTORY OF PRESENT ILLNESS:  Edwin Sutton  is a 82 y.o. male who presents with acute respiratory failure. Patient was found unresponsive at the nursing home where he stays. His oxygen level was low. EMS put him on CPAP, and brought him to the ED. Here he was found to have an elevated white blood cell count with tachypnea and tachycardia. Chest x-ray showed near total opacity of his left hemithorax. Sepsis protocol initiated, and hospitalists were called for admission.  PAST MEDICAL HISTORY:   Past Medical History  Diagnosis Date  . Hypertension   . Arthritis   . HLD (hyperlipidemia)   . Renal failure   . BPH (benign prostatic hyperplasia)     PAST SURGICAL HISTORY:   Past Surgical History  Procedure Laterality Date  . Total hip arthroplasty Bilateral     SOCIAL HISTORY:   Social History  Substance Use Topics  . Smoking status: Former Games developer  . Smokeless tobacco: Not on file  . Alcohol Use: No    FAMILY HISTORY:   Family History  Problem Relation Age of Onset  . Kidney disease Neg Hx   . Prostate cancer Neg Hx     DRUG ALLERGIES:  No Known Allergies  MEDICATIONS AT HOME:   Prior to Admission medications   Medication Sig Start Date End Date Taking? Authorizing Provider  acetaminophen (TYLENOL) 500 MG tablet Take 500-1,000 mg by mouth every 6 (six) hours as needed for mild pain or headache. Reported on 07/25/2015    Historical Provider, Edwin Sutton  amoxicillin-clavulanate (AUGMENTIN) 875-125 MG tablet Take 1 tablet by mouth 2 (two) times daily. 08/04/15   Adrian Saran, Edwin Sutton  doxycycline (VIBRAMYCIN) 100 MG capsule Take  1 capsule (100 mg total) by mouth 2 (two) times daily. 08/04/15   Adrian Saran, Edwin Sutton  feeding supplement, ENSURE ENLIVE, (ENSURE ENLIVE) LIQD Take 237 mLs by mouth 3 (three) times daily with meals. 06/26/15   Alford Highland, Edwin Sutton  feeding supplement, ENSURE ENLIVE, (ENSURE ENLIVE) LIQD Take 237 mLs by mouth 2 (two) times daily between meals. Patient not taking: Reported on 07/17/2015 07/14/15   Adrian Saran, Edwin Sutton  mirtazapine (REMERON) 7.5 MG tablet Take 7.5 mg by mouth at bedtime.    Historical Provider, Edwin Sutton  tamsulosin (FLOMAX) 0.4 MG CAPS capsule Take 1 capsule (0.4 mg total) by mouth at bedtime. 07/04/15   Harle Battiest, PA-C    REVIEW OF SYSTEMS:  Review of Systems  Unable to perform ROS: critical illness     VITAL SIGNS:   Filed Vitals:   09/08/2015 2147 09/08/15 2200  BP: 97/66 111/82  Pulse: 126 125  Temp: 99.9 F (37.7 C)   TempSrc: Axillary   Resp: 25 28  Height:  (1.727 m)   Weight: 71.5 kg (157 lb 10.1 oz)   SpO2: 100% 100%   Wt Readings from Last 3 Encounters:  09-08-15 71.5 kg (157 lb 10.1 oz)  08/01/15 74.072 kg (163 lb 4.8 oz)  07/25/15 87.091 kg (192 lb)    PHYSICAL EXAMINATION:  Physical Exam  Vitals reviewed. Constitutional: He appears well-developed and well-nourished. No distress.  HENT:  Head: Normocephalic and atraumatic.  Eyes: Conjunctivae and EOM are normal. Pupils are equal, round, and reactive to light. No scleral icterus.  Neck: Normal range of motion. Neck supple. No JVD present. No thyromegaly present.  Cardiovascular: Regular rhythm and intact distal pulses.  Exam reveals no gallop and no friction rub.   No murmur heard. Tachycardic  Respiratory: He is in respiratory distress (on BiPAP). He has no wheezes. He has no rales.  Diminished breath sounds left upper lobe, gradually diminishing until absent at left lower lobe.  GI: Soft. Bowel sounds are normal. He exhibits no distension. There is no tenderness.  Musculoskeletal: Normal range of motion.  He exhibits no edema.  No arthritis, no gout  Lymphadenopathy:    He has no cervical adenopathy.  Neurological: No cranial nerve deficit.  Unable to assess due to patient condition  Skin: Skin is warm and dry. No rash noted. No erythema.  Psychiatric:  Unable to assess due to patient condition    LABORATORY PANEL:   CBC  Recent Labs Lab 08/12/2015 2124  WBC 13.5*  HGB 9.8*  HCT 30.8*  PLT 112*   ------------------------------------------------------------------------------------------------------------------  Chemistries   Recent Labs Lab 08/25/2015 2124  NA 151*  K 3.2*  CL 118*  CO2 28  GLUCOSE 110*  BUN 17  CREATININE 1.16  CALCIUM 9.4  AST 33  ALT 9*  ALKPHOS 54  BILITOT 0.8   ------------------------------------------------------------------------------------------------------------------  Cardiac Enzymes  Recent Labs Lab 08/23/2015 2124  TROPONINI 0.04*   ------------------------------------------------------------------------------------------------------------------  RADIOLOGY:  Dg Chest Port 1 View  08/08/2015  CLINICAL DATA:  Acute onset of respiratory distress and fever. Initial encounter. EXAM: PORTABLE CHEST 1 VIEW COMPARISON:  Chest radiograph performed 08/01/2015 FINDINGS: There is near complete opacification of the left hemithorax, likely reflecting a combination of large pleural effusion and airspace opacification. The right lung remains relatively clear. No pneumothorax is seen. There is underlying left-sided volume loss, with leftward mediastinal shift. The mediastinum is not well assessed due to the pleural effusion. No acute osseous abnormalities are seen. IMPRESSION: Near complete opacification of the left hemithorax, likely reflecting a combination of large pleural effusion and airspace opacification. Underlying left-sided volume loss, with leftward mediastinal shift. Electronically Signed   By: Roanna Raider M.D.   On: 08/22/2015 21:46     EKG:   Orders placed or performed during the hospital encounter of 08/04/2015  . ED EKG 12-Lead  . ED EKG 12-Lead  . EKG 12-Lead  . EKG 12-Lead    IMPRESSION AND PLAN:  Principal Problem:   Sepsis (HCC) - IV antibiotics and fluids initiated in the ED per sepsis protocol. Blood and urine cultures sent. Sputum culture ordered. Patient is hemodynamically stable. Lactic acid was elevated at 3.1. We will continue fluid administration and check lactic acid serially until normalized. Rx as started in the ED. Tailor antibiotics as appropriate per culture results. Active Problems:   HCAP (healthcare-associated pneumonia) - IV antibiotics and cultures as above. When necessary duo nebs.   Hypernatremia - likely due to sepsis and significant dehydration. Fluids for hydration as above. We'll monitor his sodium level serially until normalized, though it is only mildly elevated.   Acute respiratory failure with hypoxemia (HCC) - Oxygenation significant improvement with BiPAP. Continue until able to wean.   Pleural effusion, left - Schedule for thoracentesis for tomorrow.   Benign essential HTN - Currently stable, continue to monitor, fluids as above, hold antihypertensives for now    Paroxysmal atrial fibrillation (HCC) -  Does not seem to be on rate controlling medications for this. We'll continue to monitor with telemetry for now.    BPH (benign prostatic hyperplasia) - continue home dose Flomax   All the records are reviewed and case discussed with ED provider. Management plans discussed with the patient and/or family.  DVT PROPHYLAXIS: SubQ lovenox  GI PROPHYLAXIS: None  ADMISSION STATUS: Inpatient  CODE STATUS: Full Code Status History    Date Active Date Inactive Code Status Order ID Comments User Context   08/01/2015 11:47 PM 08/04/2015  3:15 PM Full Code [id number]  Oralia Manisavid Jager Koska, Edwin Sutton Inpatient   07/13/2015 12:50 AM 07/14/2015  3:44 PM Full Code [id number]  Milagros LollSrikar Sudini, Edwin Sutton ED   06/19/2015  5:00  PM 06/26/2015  4:26 PM Full Code [id number]  Katha HammingSnehalatha Konidena, Edwin Sutton ED      TOTAL CRITICAL CARE TIME TAKING CARE OF THIS PATIENT: 45 minutes.    Edwin Sutton Sutton 08/27/2015, 10:39 PM  [facility]  Office  [phone]  CC: Primary care physician; Rafael BihariWALKER Sutton, Edwin Sutton, Edwin Sutton

## 2015-08-13 NOTE — ED Provider Notes (Signed)
[facility] Emergency Department Provider Note     Time seen: ----------------------------------------- 9:22 PM on 08/14/2015 -----------------------------------------  L5 caveat: Review of systems and history is limited by respiratory distress, patient brought in on CPAP  I have reviewed the triage vital signs and the nursing notes.   HISTORY  Chief Complaint Respiratory Distress    HPI Edwin Sutton is a 82 y.o. male who presents to ER for respiratory distress. EMS arrived at the nursing home to find the patient almost unresponsive. EMS could not recall room air oxygen saturation is patient was in respiratory distress and nearly unconscious. He was placed on CPAP and brought the ER emergently. Further information is available at this time, reportedly he is receiving antibiotics through a PICC line for suspected pneumonia.   Past Medical History  Diagnosis Date  . Hypertension   . Arthritis   . HLD (hyperlipidemia)   . Renal failure   . BPH (benign prostatic hyperplasia)     Patient Active Problem List   Diagnosis Date Noted  . HCAP (healthcare-associated pneumonia) 08/01/2015  . BPH (benign prostatic hyperplasia) 08/01/2015  . Pneumonia 07/13/2015  . Urinary retention 07/05/2015  . Acute delirium 06/23/2015  . Pressure ulcer 06/20/2015  . Acute renal failure (ARF) (HCC) 06/19/2015  . Breathlessness on exertion 06/01/2015  . Benign essential HTN 05/24/2015  . Combined fat and carbohydrate induced hyperlipemia 05/17/2015  . Paroxysmal atrial fibrillation (HCC) 05/17/2015  . Thrombocytopenia (HCC) 01/16/2015  . Cerebrovascular accident (CVA) (HCC) 08/29/2014    Past Surgical History  Procedure Laterality Date  . Total hip arthroplasty Bilateral     Allergies Review of patient's allergies indicates no known allergies.  Social History Social History  Substance Use Topics  . Smoking status: Former Games developermoker  . Smokeless tobacco: Not on  file  . Alcohol Use: No    Review of Systems Unknown at this time, positive for difficulty breathing ____________________________________________   PHYSICAL EXAM:  VITAL SIGNS: ED Triage Vitals  Enc Vitals Group     BP --      Pulse --      Resp --      Temp --      Temp src --      SpO2 --      Weight --      Height --      Head Cir --      Peak Flow --      Pain Score --      Pain Loc --      Pain Edu? --      Excl. in GC? --     Constitutional: Alert, moderate distress Eyes: Conjunctivae are normal. PERRL. Normal extraocular movements. ENT   Head: Normocephalic and atraumatic.   Nose: No congestion/rhinnorhea.   Mouth/Throat: Mucous membranes are moist.   Neck: No stridor. Cardiovascular: Rapid rate, regular rhythm. Normal and symmetric distal pulses are present in all extremities.  Respiratory: Tachypnea with rales bilaterally Gastrointestinal: Soft and nontender. No distention. No abdominal bruits.  Musculoskeletal: Nontender with normal range of motion in all extremities. No joint effusions.  No lower extremity tenderness nor edema.  Neurologic:  No gross focal neurologic deficits are appreciated.  Skin:  Skin is warm, dry and intact. No rash noted. Psychiatric: Mood and affect are normal. ____________________________________________  EKG: Interpreted by me. Sinus tachycardia with a rate of 125 bpm, normal PR interval, normal QRS, normal QT interval. Baseline artifact, normal axis  ____________________________________________  ED COURSE:  Pertinent labs & imaging results that were available during my care of the patient were reviewed by me and considered in my medical decision making (see chart for details). Patient is found to be febrile, likely septic or sepsis with a combination of congestive heart failure. We will check sepsis labs and chest x-ray. ____________________________________________    LABS (pertinent  positives/negatives)  Labs Reviewed  LACTIC ACID, PLASMA - Abnormal; Notable for the following:    Lactic Acid, Venous 3.1 (*)    All other components within normal limits  COMPREHENSIVE METABOLIC PANEL - Abnormal; Notable for the following:    Sodium 151 (*)    Potassium 3.2 (*)    Chloride 118 (*)    Glucose, Bld 110 (*)    Total Protein 6.3 (*)    Albumin 2.1 (*)    ALT 9 (*)    GFR calc non Af Amer 56 (*)    All other components within normal limits  TROPONIN I - Abnormal; Notable for the following:    Troponin I 0.04 (*)    All other components within normal limits  CBC WITH DIFFERENTIAL/PLATELET - Abnormal; Notable for the following:    WBC 13.5 (*)    RBC 3.26 (*)    Hemoglobin 9.8 (*)    HCT 30.8 (*)    MCHC 31.9 (*)    RDW 16.3 (*)    Platelets 112 (*)    Neutro Abs 12.1 (*)    Lymphs Abs 0.8 (*)    All other components within normal limits  CULTURE, BLOOD (ROUTINE X 2)  CULTURE, BLOOD (ROUTINE X 2)  URINE CULTURE  BRAIN NATRIURETIC PEPTIDE  LACTIC ACID, PLASMA  URINALYSIS COMPLETEWITH MICROSCOPIC ([facility] ONLY)  BLOOD GAS, VENOUS   CRITICAL CARE Performed by: Emily Filbert   Total critical care time: 30 minutes  Critical care time was exclusive of separately billable procedures and treating other patients.  Critical care was necessary to treat or prevent imminent or life-threatening deterioration.  Critical care was time spent personally by me on the following activities: development of treatment plan with patient and/or surrogate as well as nursing, discussions with consultants, evaluation of patient's response to treatment, examination of patient, obtaining history from patient or surrogate, ordering and performing treatments and interventions, ordering and review of laboratory studies, ordering and review of radiographic studies, pulse oximetry and re-evaluation of patient's condition.  RADIOLOGY Images were viewed by me  Chest  x-ray IMPRESSION: Near complete opacification of the left hemithorax, likely reflecting a combination of large pleural effusion and airspace opacification. Underlying left-sided volume loss, with leftward mediastinal shift. ____________________________________________  FINAL ASSESSMENT AND PLAN  Fever, respiratory distress, opacification of left hemithorax, pneumonia, lactic acidosis, elevated troponin  Plan: Patient with labs and imaging as dictated above. Patient presents with likely sepsis secondary to pneumonia. He's been started on a fluid challenge as well as given IV antibiotics. Currently is on BiPAP and is tolerating this well. Blood pressure 111/82. Patient states he feels like the oxygen is helping him. He will be admitted and is still in critical condition.   Emily Filbert, MD   Emily Filbert, MD September 08, 2015 2217

## 2015-08-13 NOTE — ED Notes (Signed)
Critical lab values for lactic 3.1 & troponin 0.4, MD made aware.

## 2015-08-13 NOTE — ED Notes (Signed)
Pt arrived via EMS from [facility] c/o respiratory distress. Per EMS staff reported pt having a 104 fever earlier and state they gave pt tylenol and abx, pt then began having difficulty breathing and was placed on CPAP.

## 2015-08-14 ENCOUNTER — Ambulatory Visit: Payer: Self-pay | Admitting: *Deleted

## 2015-08-14 DIAGNOSIS — J948 Other specified pleural conditions: Secondary | ICD-10-CM

## 2015-08-14 DIAGNOSIS — J189 Pneumonia, unspecified organism: Secondary | ICD-10-CM

## 2015-08-14 DIAGNOSIS — A419 Sepsis, unspecified organism: Principal | ICD-10-CM

## 2015-08-14 DIAGNOSIS — J9601 Acute respiratory failure with hypoxia: Secondary | ICD-10-CM

## 2015-08-14 DIAGNOSIS — I480 Paroxysmal atrial fibrillation: Secondary | ICD-10-CM

## 2015-08-14 DIAGNOSIS — R0600 Dyspnea, unspecified: Secondary | ICD-10-CM | POA: Insufficient documentation

## 2015-08-14 LAB — BASIC METABOLIC PANEL
Anion gap: 3 — ABNORMAL LOW (ref 5–15)
BUN: 16 mg/dL (ref 6–20)
CALCIUM: 8.6 mg/dL — AB (ref 8.9–10.3)
CO2: 27 mmol/L (ref 22–32)
CREATININE: 1.09 mg/dL (ref 0.61–1.24)
Chloride: 120 mmol/L — ABNORMAL HIGH (ref 101–111)
Glucose, Bld: 104 mg/dL — ABNORMAL HIGH (ref 65–99)
Potassium: 3 mmol/L — ABNORMAL LOW (ref 3.5–5.1)
SODIUM: 150 mmol/L — AB (ref 135–145)

## 2015-08-14 LAB — TROPONIN I
TROPONIN I: 0.06 ng/mL — AB (ref <0.031)
TROPONIN I: 0.08 ng/mL — AB (ref <0.031)
TROPONIN I: 0.09 ng/mL — AB (ref <0.031)

## 2015-08-14 LAB — CBC
HCT: 27.6 % — ABNORMAL LOW (ref 40.0–52.0)
Hemoglobin: 8.7 g/dL — ABNORMAL LOW (ref 13.0–18.0)
MCH: 29.9 pg (ref 26.0–34.0)
MCHC: 31.6 g/dL — ABNORMAL LOW (ref 32.0–36.0)
MCV: 94.8 fL (ref 80.0–100.0)
PLATELETS: 100 10*3/uL — AB (ref 150–440)
RBC: 2.91 MIL/uL — ABNORMAL LOW (ref 4.40–5.90)
RDW: 16.1 % — AB (ref 11.5–14.5)
WBC: 14.4 10*3/uL — AB (ref 3.8–10.6)

## 2015-08-14 LAB — POTASSIUM: Potassium: 3.1 mmol/L — ABNORMAL LOW (ref 3.5–5.1)

## 2015-08-14 LAB — PROTIME-INR
INR: 1.4
Prothrombin Time: 17.3 seconds — ABNORMAL HIGH (ref 11.4–15.0)

## 2015-08-14 LAB — MRSA PCR SCREENING: MRSA BY PCR: NEGATIVE

## 2015-08-14 LAB — LACTIC ACID, PLASMA: Lactic Acid, Venous: 4.4 mmol/L (ref 0.5–2.0)

## 2015-08-14 LAB — GLUCOSE, CAPILLARY: GLUCOSE-CAPILLARY: 104 mg/dL — AB (ref 65–99)

## 2015-08-14 LAB — MAGNESIUM: MAGNESIUM: 1.6 mg/dL — AB (ref 1.7–2.4)

## 2015-08-14 MED ORDER — CETYLPYRIDINIUM CHLORIDE 0.05 % MT LIQD: 7.0000 mL | Freq: Two times a day (BID) | OROMUCOSAL | Status: DC

## 2015-08-14 MED ORDER — IPRATROPIUM-ALBUTEROL 0.5-2.5 (3) MG/3ML IN SOLN
3.0000 mL | Freq: Four times a day (QID) | RESPIRATORY_TRACT | Status: DC
  Administered 2015-08-14 – 2015-08-17 (×12): 3 mL via RESPIRATORY_TRACT
  Filled 2015-08-14 (×12): qty 3

## 2015-08-14 MED ORDER — ACETAMINOPHEN 325 MG PO TABS
650.0000 mg | ORAL_TABLET | Freq: Four times a day (QID) | ORAL | Status: DC | PRN
  Administered 2015-08-16: 650 mg via ORAL
  Filled 2015-08-14: qty 2

## 2015-08-14 MED ORDER — SODIUM CHLORIDE 0.9 % IV SOLN
INTRAVENOUS | Status: DC
  Administered 2015-08-14: 11:00:00 via INTRAVENOUS

## 2015-08-14 MED ORDER — CETYLPYRIDINIUM CHLORIDE 0.05 % MT LIQD
7.0000 mL | Freq: Two times a day (BID) | OROMUCOSAL | Status: DC
  Administered 2015-08-14 – 2015-08-16 (×6): 7 mL via OROMUCOSAL

## 2015-08-14 MED ORDER — MIRTAZAPINE 15 MG PO TABS
7.5000 mg | ORAL_TABLET | Freq: Every day | ORAL | Status: DC
  Administered 2015-08-16: 22:00:00 7.5 mg via ORAL
  Filled 2015-08-14: qty 1

## 2015-08-14 MED ORDER — MORPHINE SULFATE (PF) 2 MG/ML IV SOLN
2.0000 mg | INTRAVENOUS | Status: DC | PRN
  Administered 2015-08-14 – 2015-08-15 (×2): 2 mg via INTRAVENOUS
  Filled 2015-08-14 (×2): qty 1

## 2015-08-14 MED ORDER — ALBUTEROL SULFATE (2.5 MG/3ML) 0.083% IN NEBU: 2.5000 mg | INHALATION_SOLUTION | Freq: Four times a day (QID) | RESPIRATORY_TRACT | Status: DC | PRN

## 2015-08-14 MED ORDER — BENZONATATE 100 MG PO CAPS: 200.0000 mg | ORAL_CAPSULE | Freq: Three times a day (TID) | ORAL | Status: DC | PRN

## 2015-08-14 MED ORDER — PIPERACILLIN-TAZOBACTAM 3.375 G IVPB
3.3750 g | Freq: Three times a day (TID) | INTRAVENOUS | Status: DC
  Administered 2015-08-14 – 2015-08-17 (×9): 3.375 g via INTRAVENOUS
  Filled 2015-08-14 (×14): qty 50

## 2015-08-14 MED ORDER — ENOXAPARIN SODIUM 40 MG/0.4ML ~~LOC~~ SOLN
40.0000 mg | SUBCUTANEOUS | Status: DC
  Administered 2015-08-14 – 2015-08-16 (×3): 40 mg via SUBCUTANEOUS
  Filled 2015-08-14 (×4): qty 0.4

## 2015-08-14 MED ORDER — POTASSIUM CHLORIDE 10 MEQ/100ML IV SOLN
10.0000 meq | INTRAVENOUS | Status: AC
  Administered 2015-08-14 (×4): 10 meq via INTRAVENOUS
  Filled 2015-08-14 (×4): qty 100

## 2015-08-14 MED ORDER — ENOXAPARIN SODIUM 40 MG/0.4ML ~~LOC~~ SOLN: 40.0000 mg | SUBCUTANEOUS | Status: DC

## 2015-08-14 MED ORDER — SODIUM CHLORIDE 0.9 % IV BOLUS (SEPSIS)
500.0000 mL | Freq: Once | INTRAVENOUS | Status: AC
  Administered 2015-08-14: 500 mL via INTRAVENOUS

## 2015-08-14 MED ORDER — SODIUM CHLORIDE 0.9% FLUSH
3.0000 mL | Freq: Two times a day (BID) | INTRAVENOUS | Status: DC
  Administered 2015-08-14 – 2015-08-17 (×7): 3 mL via INTRAVENOUS

## 2015-08-14 MED ORDER — DEXTROSE-NACL 5-0.45 % IV SOLN
INTRAVENOUS | Status: DC
  Administered 2015-08-14 – 2015-08-15 (×2): via INTRAVENOUS

## 2015-08-14 MED ORDER — POTASSIUM CHLORIDE CRYS ER 20 MEQ PO TBCR: 40.0000 meq | EXTENDED_RELEASE_TABLET | Freq: Once | ORAL | Status: DC

## 2015-08-14 MED ORDER — ONDANSETRON HCL 4 MG PO TABS: 4.0000 mg | ORAL_TABLET | Freq: Four times a day (QID) | ORAL | Status: DC | PRN

## 2015-08-14 MED ORDER — LEVALBUTEROL HCL 1.25 MG/0.5ML IN NEBU: 1.2500 mg | INHALATION_SOLUTION | Freq: Four times a day (QID) | RESPIRATORY_TRACT | Status: DC | PRN

## 2015-08-14 MED ORDER — ACETYLCYSTEINE 20 % IN SOLN
2.0000 mL | Freq: Four times a day (QID) | RESPIRATORY_TRACT | Status: DC
  Administered 2015-08-14 – 2015-08-16 (×6): 2 mL via RESPIRATORY_TRACT
  Filled 2015-08-14 (×10): qty 4

## 2015-08-14 MED ORDER — SODIUM CHLORIDE 0.9 % IV SOLN
INTRAVENOUS | Status: AC
  Administered 2015-08-14: via INTRAVENOUS

## 2015-08-14 MED ORDER — MAGNESIUM SULFATE 2 GM/50ML IV SOLN
2.0000 g | Freq: Once | INTRAVENOUS | Status: AC
  Administered 2015-08-14: 2 g via INTRAVENOUS
  Filled 2015-08-14: qty 50

## 2015-08-14 MED ORDER — ACETAMINOPHEN 650 MG RE SUPP
650.0000 mg | Freq: Four times a day (QID) | RECTAL | Status: DC | PRN
  Administered 2015-08-15 (×2): 650 mg via RECTAL
  Filled 2015-08-14 (×2): qty 1

## 2015-08-14 MED ORDER — PANTOPRAZOLE SODIUM 40 MG IV SOLR
40.0000 mg | INTRAVENOUS | Status: DC
  Administered 2015-08-14 – 2015-08-16 (×3): 40 mg via INTRAVENOUS
  Filled 2015-08-14 (×3): qty 40

## 2015-08-14 MED ORDER — ONDANSETRON HCL 4 MG/2ML IJ SOLN: 4.0000 mg | Freq: Four times a day (QID) | INTRAMUSCULAR | Status: DC | PRN

## 2015-08-14 MED ORDER — TAMSULOSIN HCL 0.4 MG PO CAPS
0.4000 mg | ORAL_CAPSULE | Freq: Every day | ORAL | Status: DC
  Administered 2015-08-16: 0.4 mg via ORAL
  Filled 2015-08-14 (×2): qty 1

## 2015-08-14 MED ORDER — PANTOPRAZOLE SODIUM 40 MG PO TBEC: 40.0000 mg | DELAYED_RELEASE_TABLET | Freq: Every day | ORAL | Status: DC

## 2015-08-14 MED ORDER — CHLORHEXIDINE GLUCONATE 0.12 % MT SOLN
15.0000 mL | Freq: Two times a day (BID) | OROMUCOSAL | Status: DC
  Administered 2015-08-14 – 2015-08-16 (×7): 15 mL via OROMUCOSAL
  Filled 2015-08-14: qty 15

## 2015-08-14 NOTE — NC FL2 (Signed)
[place] MEDICAID FL2 LEVEL OF CARE SCREENING TOOL     IDENTIFICATION  Patient Name: Edwin Sutton Birthdate: 05/11/1929 Sex: male Admission Date (Current Location): Feb 28, 2016  [facility] [place]   Facility and Address:  [facility], [address], [place], [place] [address]      Provider Number: [id number]  Attending Physician Name and Address:  Enid Baasadhika Kalisetti, MD  Relative Name and Phone Number:       Current Level of Care: Hospital Recommended Level of Care: Skilled Nursing Facility Prior Approval Number:    Date Approved/Denied:   PASRR Number:    Discharge Plan: SNF    Current Diagnoses: Patient Active Problem List   Diagnosis Date Noted  . Sepsis (HCC) 0Sep 27, 2017  . Hypernatremia 0Sep 27, 2017  . Acute respiratory failure with hypoxemia (HCC) 0Sep 27, 2017  . Pleural effusion, left 0Sep 27, 2017  . HCAP (healthcare-associated pneumonia) 08/01/2015  . BPH (benign prostatic hyperplasia) 08/01/2015  . Pneumonia 07/13/2015  . Urinary retention 07/05/2015  . Acute delirium 06/23/2015  . Pressure ulcer 06/20/2015  . Acute renal failure (ARF) (HCC) 06/19/2015  . Breathlessness on exertion 06/01/2015  . Benign essential HTN 05/24/2015  . Combined fat and carbohydrate induced hyperlipemia 05/17/2015  . Paroxysmal atrial fibrillation (HCC) 05/17/2015  . Thrombocytopenia (HCC) 01/16/2015  . Cerebrovascular accident (CVA) (HCC) 08/29/2014    Orientation RESPIRATION BLADDER Height & Weight     Self  Normal, O2 Continent Weight: 157 lb 10.1 oz (71.5 kg) Height:  5\' 8"  (172.7 cm)  BEHAVIORAL SYMPTOMS/MOOD NEUROLOGICAL BOWEL NUTRITION STATUS   (none)  (none) Continent  (currently npo)  AMBULATORY STATUS COMMUNICATION OF NEEDS Skin   Extensive Assist Verbally PU Stage and Appropriate Care, Surgical wounds                       Personal Care Assistance Level of Assistance  Bathing, Feeding, Dressing Bathing  Assistance: Limited assistance Feeding assistance: Limited assistance Dressing Assistance: Limited assistance     Functional Limitations Info             SPECIAL CARE FACTORS FREQUENCY  PT (By licensed PT)                    Contractures Contractures Info: Not present    Additional Factors Info  Code Status, Allergies Code Status Info: FULL Allergies Info: NKIA           Current Medications (08/14/2015):  This is the current hospital active medication list Current Facility-Administered Medications  Medication Dose Route Frequency Provider Last Rate Last Dose  . 0.9 %  sodium chloride infusion   Intravenous Continuous Shane CrutchPradeep Ramachandran, MD 125 mL/hr at 08/14/15 1122    . acetaminophen (TYLENOL) tablet 650 mg  650 mg Oral Q6H PRN Oralia Manisavid Willis, MD       Or  . acetaminophen (TYLENOL) suppository 650 mg  650 mg Rectal Q6H PRN Oralia Manisavid Willis, MD      . acetylcysteine (MUCOMYST) 20 % nebulizer / oral solution 2 mL  2 mL Nebulization 4 times per day Shane CrutchPradeep Ramachandran, MD      . antiseptic oral rinse (CPC / CETYLPYRIDINIUM CHLORIDE 0.05%) solution 7 mL  7 mL Mouth Rinse q12n4p Oralia Manisavid Willis, MD      . benzonatate (TESSALON) capsule 200 mg  200 mg Oral TID PRN Oralia Manisavid Willis, MD      . chlorhexidine (PERIDEX) 0.12 % solution 15 mL  15 mL Mouth Rinse BID Oralia Manisavid Willis,  MD   15 mL at 08/14/15 1000  . enoxaparin (LOVENOX) injection 40 mg  40 mg Subcutaneous Q24H Shane Crutch, MD      . ipratropium-albuterol (DUONEB) 0.5-2.5 (3) MG/3ML nebulizer solution 3 mL  3 mL Nebulization Q6H Shane Crutch, MD      . mirtazapine (REMERON) tablet 7.5 mg  7.5 mg Oral QHS Oralia Manis, MD   7.5 mg at 08/14/15 0130  . morphine 2 MG/ML injection 2 mg  2 mg Intravenous Q4H PRN Arnaldo Natal, MD   2 mg at 08/14/15 0606  . ondansetron (ZOFRAN) tablet 4 mg  4 mg Oral Q6H PRN Oralia Manis, MD       Or  . ondansetron [facility]) injection 4 mg  4 mg Intravenous Q6H PRN Oralia Manis, MD       . pantoprazole (PROTONIX) EC tablet 40 mg  40 mg Oral Daily Shane Crutch, MD      . piperacillin-tazobactam (ZOSYN) IVPB 3.375 g  3.375 g Intravenous 3 times per day Cy Blamer, RPH      . potassium chloride 10 mEq in 100 mL IVPB  10 mEq Intravenous Q1 Hr x 4 Cy Blamer, [facility]      . potassium chloride SA (K-DUR,KLOR-CON) CR tablet 40 mEq  40 mEq Oral Once Cy Blamer, RPH      . sodium chloride flush (NS) 0.9 % injection 3 mL  3 mL Intravenous Q12H Oralia Manis, MD   3 mL at 08/14/15 1000  . tamsulosin (FLOMAX) capsule 0.4 mg  0.4 mg Oral QHS Oralia Manis, MD   0.4 mg at 08/14/15 0131     Discharge Medications: Please see discharge summary for a list of discharge medications.  Relevant Imaging Results:  Relevant Lab Results:   Additional Information SS: [id number]  York Spaniel, LCSW

## 2015-08-14 NOTE — Progress Notes (Deleted)
Received call from Daleen SquibbMichelle Wadell, Supervisor [place] [facility] [place] [facility]. Advised her of pts passing and time of death.

## 2015-08-14 NOTE — Progress Notes (Signed)
[facility] - [place] at [facility]   PATIENT NAME: Edwin Sutton    MR#:  [id number]  DATE OF BIRTH:  12/28/1928  SUBJECTIVE:  CHIEF COMPLAINT:   Chief Complaint  Patient presents with  . Respiratory Distress   - Patient admitted from [facility] secondary to altered mental status and also hypoxia. -Chest x-ray showed near total opacification of left hemithorax possible pneumonia. Patient was admitted to ICU, remains on BiPAP. -More alert than yesterday, still not following commands or completely coherent.  REVIEW OF SYSTEMS:  Review of Systems  Unable to perform ROS: critical illness    DRUG ALLERGIES:  No Known Allergies  VITALS:  Blood pressure 114/60, pulse 84, temperature 98.4 F (36.9 C), temperature source Axillary, resp. rate 14, height  (1.727 m), weight 71.5 kg (157 lb 10.1 oz), SpO2 98 %.  PHYSICAL EXAMINATION:  Physical Exam  GENERAL:  82 y.o.-year-old patient lying in the bed, critically ill-appearing.  EYES: Pupils equal, round, reactive to light and accommodation. No scleral icterus. Extraocular muscles intact.  HEENT: Head atraumatic, normocephalic. Oropharynx and nasopharynx clear. BiPAP mask present NECK:  Supple, no jugular venous distention. No thyroid enlargement, no tenderness.  LUNGS: Coarse rhonchi bilaterally, decreased left basilar breath sounds. No wheezing or Rales heard.. No use of accessory muscles of respiration.  CARDIOVASCULAR: S1, S2 normal. No murmurs, rubs, or gallops.  ABDOMEN: Soft, nontender, nondistended. Bowel sounds present. No organomegaly or mass.  EXTREMITIES: No pedal edema, cyanosis, or clubbing.  NEUROLOGIC: Patient is encephalopathic and not able to follow any commands. Resting tremors noted. PSYCHIATRIC: The patient is alert but encephalopathic.  SKIN: No obvious rash, lesion, or ulcer.    LABORATORY PANEL:   CBC  Recent Labs Lab 08/14/15 0519  WBC 14.4*  HGB 8.7*  HCT 27.6*   PLT 100*   ------------------------------------------------------------------------------------------------------------------  Chemistries   Recent Labs Lab 08/05/2015 2124 08/14/15 0519 08/14/15 1226  NA 151* 150*  --   K 3.2* 3.0*  --   CL 118* 120*  --   CO2 28 27  --   GLUCOSE 110* 104*  --   BUN 17 16  --   CREATININE 1.16 1.09  --   CALCIUM 9.4 8.6*  --   MG  --   --  1.6*  AST 33  --   --   ALT 9*  --   --   ALKPHOS 54  --   --   BILITOT 0.8  --   --    ------------------------------------------------------------------------------------------------------------------  Cardiac Enzymes  Recent Labs Lab 08/14/15 1226  TROPONINI 0.09*   ------------------------------------------------------------------------------------------------------------------  RADIOLOGY:  Dg Chest Port 1 View  08/22/2015  CLINICAL DATA:  Acute onset of respiratory distress and fever. Initial encounter. EXAM: PORTABLE CHEST 1 VIEW COMPARISON:  Chest radiograph performed 08/01/2015 FINDINGS: There is near complete opacification of the left hemithorax, likely reflecting a combination of large pleural effusion and airspace opacification. The right lung remains relatively clear. No pneumothorax is seen. There is underlying left-sided volume loss, with leftward mediastinal shift. The mediastinum is not well assessed due to the pleural effusion. No acute osseous abnormalities are seen. IMPRESSION: Near complete opacification of the left hemithorax, likely reflecting a combination of large pleural effusion and airspace opacification. Underlying left-sided volume loss, with leftward mediastinal shift. Electronically Signed   By: Roanna Raider M.D.   On: 08/22/2015 21:46    EKG:   Orders placed or performed during the hospital encounter of 08/30/2015  .  ED EKG 12-Lead  . ED EKG 12-Lead  . EKG 12-Lead  . EKG 12-Lead    ASSESSMENT AND PLAN:   82 year old male with past medical history significant for  hypertension, benign prostatic hyperplasia,, persistent hoarseness of voice presents from nursing home secondary to altered mental status and hypoxia.  #1 metabolic encephalopathy- could be from hypernatremia or hypoxia. Also from his sepsis. -Continue to monitor closely. CT of the head with no acute findings.  #2 acute hypoxic respiratory failure- on Bipap CXR with left sided collapse, minimal effusion per Intensivist who has used an ultrasound to check at bedside So, likely no indication for thoracentesis - Cont Bipap for now - If no improvement- may be CT chest and also chances of being intubated high - cont vanc and zosyn and f/u cultures  #3 Hypertension- with sepsis now, IV fluids, not on any BP meds  #4 failure to thrive- cont remeron.  #5 hypernatremia- change fluids to D5 1/2 NS. Monitor Likely from poor po intake  #6 hypokalemia and hypomagnesemia- being replaced, f/u in AM  #7 sepsis- secondary to pneumonia likely In ICU, IV fluids, lactic acid elevated On ABX, not on pressors yet  #8 chronic anemia- baseline hb around 9, pretty close to baseline now. Monitor  #9  DVT prophylaxis-Lovenox   Overall poor prognosis. Will put palliative care consult    All the records are reviewed and case discussed with Care Management/Social Workerr. Management plans discussed with the patient, family and they are in agreement.  CODE STATUS: Full code  TOTAL CRITICAL CARE TIME SPENT IN TAKING CARE OF THIS PATIENT: 45 minutes.   POSSIBLE D/C IN 2-3 DAYS, DEPENDING ON CLINICAL CONDITION.   Enid BaasKALISETTI,Yarah Fuente M.D on 08/14/2015 at 1:48 PM  Between 7am to 6pm - Pager - [phone]  After 6pm go to [email or web address] - password [email or web address] [facility]  [facility]  Office  [phone]  CC: Primary care physician; Rafael BihariWALKER III, JOHN B, MD

## 2015-08-14 NOTE — Progress Notes (Addendum)
Pharmacy ICU Daily Progress Note  CC is an 82yo male admitted 3/12 found down at [facility]. Pt septic.  Of note, patient recently admitted 2/28 for weakness and encephalopathy 2/2 recurrent left-sided HCAP and poor PO intake.   Active pharmacy consults: vancomycin and zosyn management, electrolyte management   Plan: 1. D/C vanc due to MRSA PCR (-). 2. Change zosyn dose to 3.375gm EIV q8hrs due to weight <120kg.  3. KCl 10mEq q1hr x4 (no PO supp due to BiPAP). Recheck K 2 hours after last K dose. 3/13 Add-on magnesium level 1.6. Ordered Magnesium 2g IVPB x1. Will recheck Mag with AM labs.   Infectious:  Antimicrobials:  Zosyn 3/12>> Vancomycin 3/12>>3/12 S/p Levofloxacin 750mg  x1 WBC 14.4 (13.5) Tmax 100.3 Procalcitonin/LDH: LA 3.1 on arrival, then 4.4 Culture Results: MRSA PCR (-) 3/12 UA (-) 3/12 UCx NGTD  Electrolytes:  Potassium: 3.0 Magnesium: pending Phosphorus: ? Supplementation plans: KCl 10mEq q1hr x4, Mag 2g IVPB x1. Recheck K 2 hours after last K dose, recheck Mag with AM labs.  Pulmonary: Bronchodilators? Levalbuterol changed to albuterol nebs prn Ventilator status: None O2 sat/FiO2: 97 on BiPAP, 40%  Current steroids: None Taper plans:   GI:  Constipation PPx: None Feeding status: NPO LBM: 3/12 SUP: Pantoprazole 40mg  PO daily (due to thrombocytopenia)  Insulin:  SSI use in 24hrs: None Current Insulin orders: None Last 3 CBGs: 104, 110  DVT PPx: Lovenox 40mg  SQ daily, TEDs/SCDs  Sedation+Pain:  RASS goal?  GCS 13 Last pain score: 0 Opioid use in last 24 hrs: morphine 2mg  May need to add moderate pain orders  Pressors: None MAP goal:   Medication education/counseling required? No

## 2015-08-14 NOTE — Progress Notes (Signed)
Pharmacy ICU Daily Progress Note  CC is an 82yo male admitted 3/12 found down at [facility]. Pt septic.  Of note, patient recently admitted 2/28 for weakness and encephalopathy 2/2 recurrent left-sided HCAP and poor PO intake.   Active pharmacy consults: vancomycin and zosyn management, electrolyte management   Plan: 1. D/C vanc due to MRSA PCR (-). 2. Change zosyn dose to 3.375gm EIV q8hrs due to weight <120kg.  3. KCl 10mEq q1hr x4 (no PO supp due to BiPAP). Recheck K 2 hours after last K dose. 3/13 Add-on magnesium level 1.6. Ordered Magnesium 2g IVPB x1. Will recheck Mag with AM labs.   Infectious:  Antimicrobials:  Zosyn 3/12>> Vancomycin 3/12>>3/12 S/p Levofloxacin 750mg  x1 WBC 14.4 (13.5) Tmax 100.3 Procalcitonin/LDH: LA 3.1 on arrival, then 4.4 Culture Results: MRSA PCR (-) 3/12 UA (-) 3/12 UCx NGTD  Electrolytes:  Potassium: 3.1 Magnesium: 1.6 Phosphorus: ? Supplementation plans: lab was drawn while the last K was being run. Lab not accurate. Will recheck with the AM labs and supplement as needed  Pulmonary: Bronchodilators? Levalbuterol changed to albuterol nebs prn Ventilator status: None O2 sat/FiO2: 97 on BiPAP, 40%  Current steroids: None Taper plans:   GI:  Constipation PPx: None Feeding status: NPO LBM: 3/12 SUP: Pantoprazole 40mg  PO daily (due to thrombocytopenia)  Insulin:  SSI use in 24hrs: None Current Insulin orders: None Last 3 CBGs: 104, 110  DVT PPx: Lovenox 40mg  SQ daily, TEDs/SCDs  Sedation+Pain:  RASS goal?  GCS 13 Last pain score: 0 Opioid use in last 24 hrs: morphine 2mg  May need to add moderate pain orders  Pressors: None MAP goal:   Medication education/counseling required? No

## 2015-08-14 NOTE — Consult Note (Addendum)
[facility] Critical Care Medicine Consultation     ASSESSMENT/PLAN   82 year old male with fourth admission to [facility] this year. Progressive debility with decline in functional status over the last several months, now presents for the third time in the last 2 months with pneumonia, complicated by left lung atelectasis and sepsis.  PULMONARY  A: Acute respiratory failure due to pneumonia with sepsis. Left lung atelectasis, likely mucous plugging. Elevated right diaphragm, possibly paralyzed. P:   -Continue broad-spectrum antibiotics. -Continue BiPAP at 12 over 5, currently on 40% FiO2, we'll wean down as tolerated. -Lactic acid was elevated, we'll give 500 mL bolus, and increase fluid rate from 75 to 125 mL per hour. -Left lung atelectasis, likely due to pneumonia and/or mucous plugging. The patient may require intubation, will initiate percussion, as well as mucolytic's. -Given the patient's high oxygen requirements. On BiPAP, he would not be amenable to bronchoscopy at this time. The patient may require intubation, we'll await family presents for further discussions, given his significant decline over the last few months. We may want to consider changing his CODE STATUS to DO NOT RESUSCITATE.  CARDIOVASCULAR  A: Sepsis, paroxysmal atrial fibrillation, currently rate controlled. P:  We'll continue to monitor.  RENAL A:  Acute kidney injury, hypernatremia and hypokalemia. Likely element of dehydration with sepsis. P:   Continue rehydration, monitor sodium. Replace potassium.  GASTROINTESTINAL A:  Albumin 2.1. Decreased by mouth intake with malnutrition. P:   GI prophylaxis.  HEMATOLOGIC A:  Thrombocytopenia, mild. P:  Will monitor, can continue DVT prophylaxis for the time being.  INFECTIOUS A:  Pneumonia with sepsis, likely hospital-acquired pneumonia. P:    MICRO DATA: BCx2 3/12: Negative thus far. UC 3/12: Negative thus far. Sputum 3/13: Pending.  ANTIMICROBIALS:   Vancomycin 3/12>> 3/13. Zosyn 3/12>>  ENDOCRINE A:   Eu-Glycemic. P:   We'll continue to monitor blood sugars.  NEUROLOGIC A:  --  Social: A discussion with the patient's 6 children in the presence of the critical care RN regarding the patient's condition. I explained that he's had recurrent admissions, progressive functional decline, recurrent pneumonia episodes, and his family tells me that he has not been happy with his current status and we have admissions. I explained he now has collapsed of left lung due to atelectasis and he may require life support to recover. I further explained that even if he does recover from this. It is likely that he will continue to bounce and out of the hospital. They felt that he would not want that to happen, and would not want to be maintained alive on life support. Therefore, the patient was made DO NOT RESUSCITATE. Today, we will continue current therapy for the next 2-3 days, if the patient does not improve, we can transition him to comfort measures only. The patient's daughter stated several times that she does not want the patient to suffer and family members were in agreement.  ---------------------------------------  ---------------------------------------   Name: Edwin Sutton MRN: [id number] DOB: April 02, 1929    ADMISSION DATE:  08/25/15 CONSULTATION DATE: 08/14/15  REFERRING MD :  Dr. Nemiah Commander  CHIEF COMPLAINT:  Dyspnea.    HISTORY OF PRESENT ILLNESS:   The patient is an 82 year old male with a history of CVA, thrombocytopenia, hypertension paroxysmal atrial fibrillation, chronic hoarseness of voice. He is admitted to the hospital at this time, this is his fourth admission to [facility] in the last 3 months. Currently the patient is confused and on a BiPAP, therefore, all history is obtained from  the chart and from staff.  The patient was previously seen on February 28 with pneumonia and discharged on March 3, he had previously been discharged  on February 10 with the same diagnosis. I briefly spoke with his son who was at the bedside, who said that his status has been progressively declining over the last several months, he has minimal by mouth intake and does rarely drink supplements that he is provided. He is lost a lot of weight over the last several months as well.  Review of the chest ray films today shows a near complete left lung atelectasis, compared with previous films. There is no change in the right lung, there appears to be a chronically elevated right diaphragm. He has chronic problems with hoarseness of voice, and recently he has had issues with a sacral decubitus ulcer. At his last admission he was seen by speech therapy, no overt signs of aspiration were noted during a swallow evaluation, he was recommended to have a dysphagia 2 diet.  Currently, he is admitted to the hospital after being found unresponsive at his nursing home, apparently had a fever of 104, he subsequently developed trouble breathing and was placed on CPAP.  PAST MEDICAL HISTORY :  Past Medical History  Diagnosis Date  . Hypertension   . Arthritis   . HLD (hyperlipidemia)   . Renal failure   . BPH (benign prostatic hyperplasia)    Past Surgical History  Procedure Laterality Date  . Total hip arthroplasty Bilateral    Prior to Admission medications   Medication Sig Start Date End Date Taking? Authorizing Provider  Amino Acids-Protein Hydrolys (FEEDING SUPPLEMENT, PRO-STAT SUGAR FREE 64,) LIQD Take 30 mLs by mouth 2 (two) times daily.   Yes Historical Provider, MD  cefTRIAXone 1 g in dextrose 5 % 50 mL Inject 1 g into the vein daily. 08/27/2015 08/20/15 Yes Historical Provider, MD  doxycycline (VIBRAMYCIN) 100 MG capsule Take 1 capsule (100 mg total) by mouth 2 (two) times daily. 08/04/15  Yes Adrian Saran, MD  guaifenesin (ROBITUSSIN) 100 MG/5ML syrup Take 400 mg by mouth every 6 (six) hours as needed for cough.   Yes Historical Provider, MD    ipratropium-albuterol (DUONEB) 0.5-2.5 (3) MG/3ML SOLN Take 3 mLs by nebulization every 4 (four) hours as needed (for cough).   Yes Historical Provider, MD  mirtazapine (REMERON) 7.5 MG tablet Take 7.5 mg by mouth at bedtime.   Yes Historical Provider, MD  Multiple Vitamin (MULTIVITAMIN WITH MINERALS) TABS tablet Take 1 tablet by mouth daily.   Yes Historical Provider, MD  Nutritional Supplements (ENSURE PLUS PO) Take 237 mLs by mouth 5 (five) times daily.   Yes Historical Provider, MD  potassium chloride (KLOR-CON) 20 MEQ packet Take 20 mEq by mouth 2 (two) times daily.   Yes Historical Provider, MD  potassium chloride in dextrose 5 % and 0.45% NaCl 1,000 mL Inject 20 mEq into the vein continuous. Run at 159mL/hr for a total of   Yes Historical Provider, MD  tamsulosin (FLOMAX) 0.4 MG CAPS capsule Take 1 capsule (0.4 mg total) by mouth at bedtime. 07/04/15  Yes Shannon A McGowan, PA-C   No Known Allergies  FAMILY HISTORY:  Family History  Problem Relation Age of Onset  . Kidney disease Neg Hx   . Prostate cancer Neg Hx    SOCIAL HISTORY:  reports that he has quit smoking. He does not have any smokeless tobacco history on file. He reports that he does not drink alcohol  or use illicit drugs.  REVIEW OF SYSTEMS:   Cannot obtain review systems is a patient is currently on full facial BiPAP.   VITAL SIGNS: Temp:  [98.4 F (36.9 C)-100.3 F (37.9 C)] 98.4 F (36.9 C) (03/13 0800) Pulse Rate:  [84-135] 84 (03/13 1100) Resp:  [14-29] 14 (03/13 1100) BP: (80-146)/(59-107) 114/60 mmHg (03/13 1100) SpO2:  [35 %-100 %] 98 % (03/13 1100) FiO2 (%):  [40 %] 40 % (03/13 1100) Weight:  [157 lb 10.1 oz (71.5 kg)] 157 lb 10.1 oz (71.5 kg) (03/12 2147) HEMODYNAMICS:   VENTILATOR SETTINGS: Vent Mode:  [-]  FiO2 (%):  [40 %] 40 % INTAKE / OUTPUT:  Intake/Output Summary (Last 24 hours) at 08/14/15 1113 Last data filed at 08/14/15 1000  Gross per 24 hour  Intake 1034.25 ml  Output       0 ml  Net 1034.25 ml    Physical Examination:   VS: BP 114/60 mmHg  Pulse 84  Temp(Src) 98.4 F (36.9 C) (Axillary)  Resp 14  Ht 5\' 8"  (1.727 m)  Wt 157 lb 10.1 oz (71.5 kg)  BMI 23.97 kg/m2  SpO2 98%  General Appearance: No distress  Neuro:without focal findings, mental status, reduced HEENT: PERRLA, EOM intact, no ptosis, no other lesions noticed;  Pulmonary: normal breath sounds., diaphragmatic excursion normal.decreased breath sounds on left side.  CardiovascularNormal S1,S2.  No m/r/g.    Abdomen: Benign, Soft, non-tender, No masses, hepatosplenomegaly, No lymphadenopathy Renal:  No costovertebral tenderness  GU:  Not performed at this time. Endoc: No evident thyromegaly, no signs of acromegaly. Skin:   warm, no rashes, no ecchymosis  Extremities: normal, no cyanosis, clubbing, no edema, warm with normal capillary refill.    LABS: Reviewed   LABORATORY PANEL:   CBC  Recent Labs Lab 08/14/15 0519  WBC 14.4*  HGB 8.7*  HCT 27.6*  PLT 100*    Chemistries   Recent Labs Lab 08/25/2015 2124 08/14/15 0519  NA 151* 150*  K 3.2* 3.0*  CL 118* 120*  CO2 28 27  GLUCOSE 110* 104*  BUN 17 16  CREATININE 1.16 1.09  CALCIUM 9.4 8.6*  AST 33  --   ALT 9*  --   ALKPHOS 54  --   BILITOT 0.8  --      Recent Labs Lab 08/14/15 0009  GLUCAP 104*   No results for input(s): PHART, PCO2ART, PO2ART in the last 168 hours.  Recent Labs Lab 08/07/2015 2124  AST 33  ALT 9*  ALKPHOS 54  BILITOT 0.8  ALBUMIN 2.1*    Cardiac Enzymes  Recent Labs Lab 08/14/15 0519  TROPONINI 0.08*    RADIOLOGY:  Dg Chest Port 1 View  08/19/2015  CLINICAL DATA:  Acute onset of respiratory distress and fever. Initial encounter. EXAM: PORTABLE CHEST 1 VIEW COMPARISON:  Chest radiograph performed 08/01/2015 FINDINGS: There is near complete opacification of the left hemithorax, likely reflecting a combination of large pleural effusion and airspace opacification. The right lung  remains relatively clear. No pneumothorax is seen. There is underlying left-sided volume loss, with leftward mediastinal shift. The mediastinum is not well assessed due to the pleural effusion. No acute osseous abnormalities are seen. IMPRESSION: Near complete opacification of the left hemithorax, likely reflecting a combination of large pleural effusion and airspace opacification. Underlying left-sided volume loss, with leftward mediastinal shift. Electronically Signed   By: Roanna RaiderJeffery  Chang M.D.   On: 09/01/2015 21:46    Chest ultrasound: Cardiac probe was placed on the  posterior chest wall, the left lung was scanned from the base to the apex and the mid clavicular line. There was a small effusion at the left base, there was significant amount of atelectasis extending upwards from the base to the thoracic level. Conclusion: small left pleural effusion, significant atelectasis of the left lung.   --Wells Guiles, MD.  Board Certified in Internal Medicine, Pulmonary Medicine, Critical Care Medicine, and Sleep Medicine.  [facility]  Santiago Glad, M.D.  Stephanie Acre, M.D.  Billy Fischer, M.D   08/14/2015, 11:13 AM   Critical Care Attestation.  I have personally obtained a history, examined the patient, evaluated laboratory and imaging results, formulated the assessment and plan and placed orders. The Patient requires high complexity decision making for assessment and support, frequent evaluation and titration of therapies, application of advanced monitoring technologies and extensive interpretation of multiple databases. The patient has critical illness that could lead imminently to failure of 1 or more organ systems and requires the highest level of physician preparedness to intervene.  Critical Care Time devoted to patient care services described in this note is 35 minutes and is exclusive of time spent in procedures.

## 2015-08-14 NOTE — Progress Notes (Signed)
Chaplain rounded in the unit and provided a compassionate presence to the patient and said a silent prayer in support. Chaplain Sigmond Patalano [phone] 

## 2015-08-14 NOTE — Clinical Social Work Note (Signed)
Clinical Social Work Assessment  Patient Details  Name: Edwin ErikssonClayton Sutton MRN: [id number] Date of Birth: 07/05/1928  Date of referral:  08/14/15               Reason for consult:  Facility Placement                Permission sought to share information with:    Permission granted to share information::     Name::        Agency::     Relationship::     Contact Information:     Housing/Transportation Living arrangements for the past 2 months:  [facility] Source of Information:  Adult Children Patient Interpreter Needed:  None Criminal Activity/Legal Involvement Pertinent to Current Situation/Hospitalization:  No - Comment as needed Significant Relationships:  Adult Children Lives with:  Facility Resident Do you feel safe going back to the place where you live?    Need for family participation in patient care:     Care giving concerns:  [facility]   Social Worker assessment / plan:  Patient unable to participate in initial assessment due to bipap and sleeping. CSW contacted patient's daughter: Edwin Sutton: [phone] via phone. Ms. [name] stated that [facility] for about 2 weeks. Ms. [name] stated that she felt patient was discharged from the hospital too soon and then [facility], they did not seem very attentive to his care needs. Ms. [name] stated that if possible, she would like to get him to [facility] but stated in the same sentence that she would want to go visit [facility] before accepting a bed. CSW encouraged her to go ahead and visit [facility] in the event they can offer so she can make a timely decision. Ms. [name] agreed and also agrees to [facility] if no bed found at [facility]. Patient has medicare humana thn and would require reauth for return to SNF.    Employment status:  Retired Database administratornsurance information:  Managed Medicare PT Recommendations:  Not assessed at this time Information  / Referral to community resources:     Patient/Family's Response to care:  Patient's sister was very Adult nurseappreciative of CSW assistance.  Patient/Family's Understanding of and Emotional Response to Diagnosis, Current Treatment, and Prognosis:  Patient's sister expressed concern regarding lack of attention to patient while [facility].   Emotional Assessment Appearance:  Appears stated age Attitude/Demeanor/Rapport:  Unable to Assess (sleeping and on cpap) Affect (typically observed):  Unable to Assess (sleeping and on cpap) Orientation:  Oriented to Self Alcohol / Substance use:  Not Applicable Psych involvement (Current and /or in the community):  No (Comment)  Discharge Needs  Concerns to be addressed:  Care Coordination Readmission within the last 30 days:  Yes Current discharge risk:  None Barriers to Discharge:  No Barriers Identified   York SpanielMonica Khamia Stambaugh, LCSW 08/14/2015, 11:22 AM

## 2015-08-14 NOTE — Care Management (Signed)
This is the fourth admission in six months.  [facility].  He is readmitted to icu stepdown for sx concerning for sepsis.  He has an unstagable sacral decubitus.  CSW  referral entered.  patient is a full code.  During progression, discussed the possible benefit of a palliative consult.  Patient is a full code.  At present, will hold on obtaining this consult per intensivist.

## 2015-08-14 NOTE — Progress Notes (Signed)
Notified Dr. Anne HahnWillis of lactic of 4.4. Fluid bolus ordered.  Notified Dr. Sheryle Hailiamond of Troponin 0.06. He will relay to Dr. Anne HahnWillis.

## 2015-08-14 NOTE — Progress Notes (Signed)
After the chest xray the ETT needed to be advanced to 25 at the lip. This was done. The pt. Has bilateral breath sounds.

## 2015-08-15 ENCOUNTER — Ambulatory Visit: Payer: Commercial Managed Care - HMO | Admitting: Urology

## 2015-08-15 ENCOUNTER — Inpatient Hospital Stay: Payer: Commercial Managed Care - HMO

## 2015-08-15 LAB — BASIC METABOLIC PANEL
ANION GAP: 6 (ref 5–15)
BUN: 14 mg/dL (ref 6–20)
CHLORIDE: 118 mmol/L — AB (ref 101–111)
CO2: 23 mmol/L (ref 22–32)
Calcium: 8.7 mg/dL — ABNORMAL LOW (ref 8.9–10.3)
Creatinine, Ser: 1.1 mg/dL (ref 0.61–1.24)
GFR calc non Af Amer: 60 mL/min — ABNORMAL LOW (ref >60)
Glucose, Bld: 97 mg/dL (ref 65–99)
Potassium: 2.8 mmol/L — CL (ref 3.5–5.1)
Sodium: 147 mmol/L — ABNORMAL HIGH (ref 135–145)

## 2015-08-15 LAB — CBC
HEMATOCRIT: 28.6 % — AB (ref 40.0–52.0)
HEMOGLOBIN: 9.3 g/dL — AB (ref 13.0–18.0)
MCH: 30.5 pg (ref 26.0–34.0)
MCHC: 32.4 g/dL (ref 32.0–36.0)
MCV: 94.3 fL (ref 80.0–100.0)
Platelets: 87 10*3/uL — ABNORMAL LOW (ref 150–440)
RBC: 3.03 MIL/uL — ABNORMAL LOW (ref 4.40–5.90)
RDW: 15.7 % — ABNORMAL HIGH (ref 11.5–14.5)
WBC: 15.5 10*3/uL — AB (ref 3.8–10.6)

## 2015-08-15 LAB — POTASSIUM: POTASSIUM: 2.7 mmol/L — AB (ref 3.5–5.1)

## 2015-08-15 LAB — URINE CULTURE: CULTURE: NO GROWTH

## 2015-08-15 LAB — LACTIC ACID, PLASMA: LACTIC ACID, VENOUS: 2.3 mmol/L — AB (ref 0.5–2.0)

## 2015-08-15 LAB — MAGNESIUM: Magnesium: 1.9 mg/dL (ref 1.7–2.4)

## 2015-08-15 MED ORDER — DEXTROSE-NACL 5-0.45 % IV SOLN
INTRAVENOUS | Status: DC
  Administered 2015-08-15: 09:00:00 via INTRAVENOUS

## 2015-08-15 MED ORDER — MORPHINE SULFATE (PF) 2 MG/ML IV SOLN
2.0000 mg | INTRAVENOUS | Status: DC | PRN
  Administered 2015-08-15 – 2015-08-16 (×2): 2 mg via INTRAVENOUS
  Filled 2015-08-15 (×2): qty 1

## 2015-08-15 MED ORDER — POTASSIUM CHLORIDE 10 MEQ/100ML IV SOLN
10.0000 meq | INTRAVENOUS | Status: DC
  Administered 2015-08-15 (×2): 10 meq via INTRAVENOUS
  Filled 2015-08-15 (×4): qty 100

## 2015-08-15 MED ORDER — POTASSIUM CHLORIDE 10 MEQ/100ML IV SOLN
10.0000 meq | INTRAVENOUS | Status: AC
  Administered 2015-08-15 (×6): 10 meq via INTRAVENOUS
  Filled 2015-08-15 (×6): qty 100

## 2015-08-15 MED ORDER — MAGNESIUM SULFATE 2 GM/50ML IV SOLN
2.0000 g | Freq: Once | INTRAVENOUS | Status: AC
  Administered 2015-08-15: 2 g via INTRAVENOUS
  Filled 2015-08-15: qty 50

## 2015-08-15 MED ORDER — POTASSIUM CHLORIDE 10 MEQ/100ML IV SOLN
10.0000 meq | INTRAVENOUS | Status: DC
  Filled 2015-08-15 (×6): qty 100

## 2015-08-15 NOTE — Progress Notes (Signed)
Pharmacy ICU Daily Progress Note  CC is an 82yo male admitted 3/12 found [facility]. Pt septic.  Of note, patient recently admitted 2/28 for weakness and encephalopathy 2/2 recurrent left-sided HCAP and poor PO intake.   Active pharmacy consults: zosyn management, electrolyte management   Plan: 1. Continue zosyn 3.375gm EIV q8hrs 2.3/14 1400 K 2.7, pt is s/p KCl 10mEq IV x2. Will give KCl 10mEq q1hr x6 (no PO supp due to BiPAP). Recheck K 2 hours after last dose.  Magnesium level 1.9, will give Mag 2g IV x1.    Infectious:  Antimicrobials:  Zosyn 3/12>> Vancomycin 3/12>>3/12 S/p Levofloxacin 750mg  x1 WBC 15.5 (14.4, 13.5) Tmax 101.7 Procalcitonin/LDH: LA 3.1 on arrival, then 4.4 Culture Results: MRSA PCR (-) 3/12 UA (-) 3/12 UCx NGTD  Electrolytes:  Potassium: 2.7 Magnesium: 1.9 Phosphorus: ? Supplementation plans: 3/14 1400 K 2.7, pt is s/p KCl 10mEq IV x2. Will give KCl 10mEq q1hr x6 (no PO supp due to BiPAP). Recheck K 2 hours after last dose.  Magnesium level 1.9, will give Mag 2g IV x1.     Pulmonary: Bronchodilators? albuterol nebs prn Ventilator status: None O2 sat/FiO2: 97 on BiPAP, 40%  Current steroids: None Taper plans:   GI:  Constipation PPx: None Feeding status: NPO LBM: 3/12 SUP: Pantoprazole 40mg  PO daily (due to thrombocytopenia)  Insulin:  SSI use in 24hrs: None Current Insulin orders: None Last 3 CBGs: 104, 110  DVT PPx: Lovenox 40mg  SQ daily, TEDs/SCDs (plts 87, down from 100)  Sedation+Pain:  RASS goal?  GCS 13 Last pain score: 0 Opioid use in last 24 hrs: morphine 2mg  May need to add moderate pain orders  Pressors: None MAP goal:   Medication education/counseling required? No

## 2015-08-15 NOTE — Progress Notes (Signed)
Pharmacy ICU Daily Progress Note  CC is an 82yo male admitted 3/12 found down at [facility]. Pt septic.  Of note, patient recently admitted 2/28 for weakness and encephalopathy 2/2 recurrent left-sided HCAP and poor PO intake.   Active pharmacy consults: zosyn management, electrolyte management   Plan: 1. Continue zosyn 3.375gm EIV q8hrs 2. KCl 10mEq q1hr x4 (no PO supp due to BiPAP). Recheck K 2 hours after last K dose @1400 .  Magnesium level 1.9.   Infectious:  Antimicrobials:  Zosyn 3/12>> Vancomycin 3/12>>3/12 S/p Levofloxacin 750mg  x1 WBC 15.5 (14.4, 13.5) Tmax 101.7 Procalcitonin/LDH: LA 3.1 on arrival, then 4.4 Culture Results: MRSA PCR (-) 3/12 UA (-) 3/12 UCx NGTD  Electrolytes:  Potassium: 2.8 Magnesium: 1.9 Phosphorus: ? Supplementation plans: KCl 10mEq q1hr x4, follow up level at 1400 on 3/14.   Pulmonary: Bronchodilators? albuterol nebs prn Ventilator status: None O2 sat/FiO2: 97 on BiPAP, 40%  Current steroids: None Taper plans:   GI:  Constipation PPx: None Feeding status: NPO LBM: 3/12 SUP: Pantoprazole 40mg  PO daily (due to thrombocytopenia)  Insulin:  SSI use in 24hrs: None Current Insulin orders: None Last 3 CBGs: 104, 110  DVT PPx: Lovenox 40mg  SQ daily, TEDs/SCDs (plts 87, down from 100)  Sedation+Pain:  RASS goal?  GCS 13 Last pain score: 0 Opioid use in last 24 hrs: morphine 2mg  May need to add moderate pain orders  Pressors: None MAP goal:   Medication education/counseling required? No

## 2015-08-15 NOTE — Clinical Documentation Improvement (Signed)
Hospitalist  Can the diagnosis of pressure ulcer be further specified?   Document if pressure ulcer with stage is Present on Admission   Document Site with laterality - Elbow, Back (upper/lower), Sacral, Hip, Buttock, Ankle, Heel, Head, Other (Specify)  Pressure Ulcer Stage - Stage1, Stage 2, Stage 3, Stage 4, Unstageable, Unspecified, Unable to Clinically Determine  Document any associated diagnoses/conditions such as: with gangrene  Other  Clinically Undetermined    Supporting Information: Recently had issues with a sacral decubitus ulcer per 3/13 progress notes.  Pressure ulcer, unstageable per 3/13 flowsheets,   Please exercise your independent, professional judgment when responding. A specific answer is not anticipated or expected.   Thank Sabino DonovanYou,  Cailie Bosshart [facility] [employer] [facility] [place] [phone]

## 2015-08-15 NOTE — Progress Notes (Signed)
Called pharmacy concerning AM potassium level.  There is a pharmacy consult in the orders.

## 2015-08-15 NOTE — Progress Notes (Signed)
MEDICATION RELATED CONSULT NOTE - INITIAL   Pharmacy Consult for Electrolyte Indication: hypokalemia  No Known Allergies  Patient Measurements: Height: 5\' 8"  (172.7 cm) Weight: 157 lb 10.1 oz (71.5 kg) IBW/kg (Calculated) : 68.4   Vital Signs: Temp: 100.5 F (38.1 C) (03/14 0100) Temp Source: Axillary (03/14 0100) BP: 113/69 mmHg (03/14 0100) Pulse Rate: 101 (03/14 0100) Intake/Output from previous day: 03/13 0701 - 03/14 0700 In: 1566.8 [I.V.:1066.8; IV Piggyback:500] Out: -  Intake/Output from this shift:    Labs:  Recent Labs  12-31-2015 2124 08/14/15 0519 08/14/15 1226 08/15/15 0345  WBC 13.5* 14.4*  --  15.5*  HGB 9.8* 8.7*  --  9.3*  HCT 30.8* 27.6*  --  28.6*  PLT 112* 100*  --  87*  CREATININE 1.16 1.09  --  1.10  MG  --   --  1.6* 1.9  ALBUMIN 2.1*  --   --   --   PROT 6.3*  --   --   --   AST 33  --   --   --   ALT 9*  --   --   --   ALKPHOS 54  --   --   --   BILITOT 0.8  --   --   --    Estimated Creatinine Clearance: 48.4 mL/min (by C-G formula based on Cr of 1.1).  Assessment: 82 yo male with hypokalemia 3/14 K 2.8, Mg 1.9  Goal of Therapy:  K 3.5-5.0  Plan:  KCl 10 mEq IV x4 runs K check at 1400 - may need to retime if infusion of doses is delayed.  Marty HeckWang, Devynn Hessler L 08/15/2015,6:32 AM

## 2015-08-15 NOTE — Progress Notes (Addendum)
[facility] [place] at [facility]   PATIENT NAME: Lamarcus Spira    MR#:  [id number]  DATE OF BIRTH:  February 04, 1929  SUBJECTIVE:  CHIEF COMPLAINT:   Chief Complaint  Patient presents with  . Respiratory Distress   - Remains on BiPAP today. Chest x-ray shows improvement of the left-sided atelectasis today. -Mental status still not much improvement. Patient is irritable and moaning. Responsive to name but not following commands today. - Febrile this morning  REVIEW OF SYSTEMS:  Review of Systems  Unable to perform ROS: critical illness    DRUG ALLERGIES:  No Known Allergies  VITALS:  Blood pressure 118/71, pulse 97, temperature 101.7 F (38.7 C), temperature source Axillary, resp. rate 21, height  (1.727 m), weight 71.5 kg (157 lb 10.1 oz), SpO2 99 %.  PHYSICAL EXAMINATION:  Physical Exam  GENERAL:  82 y.o.-year-old patient lying in the bed, critically ill-appearing.  EYES: Pupils equal, round, reactive to light and accommodation. No scleral icterus. Extraocular muscles intact.  HEENT: Head atraumatic, normocephalic. Oropharynx and nasopharynx clear. BiPAP mask present NECK:  Supple, no jugular venous distention. No thyroid enlargement, no tenderness.  LUNGS: Coarse rhonchi bilaterally, decreased left basilar breath sounds. No wheezing or Rales heard.. No use of accessory muscles of respiration.  CARDIOVASCULAR: S1, S2 normal. No murmurs, rubs, or gallops.  ABDOMEN: Soft, nontender, nondistended. Bowel sounds present. No organomegaly or mass.  EXTREMITIES: No pedal edema, cyanosis, or clubbing.  NEUROLOGIC: Patient is encephalopathic and not able to follow any commands. Resting tremors noted. Alert to his name. Speech is not understandable, likely from the BiPAP. PSYCHIATRIC: The patient is alert but encephalopathic.  SKIN: No obvious rash, lesion, or ulcer.    LABORATORY PANEL:   CBC  Recent Labs Lab 08/15/15 0345  WBC 15.5*  HGB 9.3*   HCT 28.6*  PLT 87*   ------------------------------------------------------------------------------------------------------------------  Chemistries   Recent Labs Lab 08-27-15 2124  08/15/15 0345  NA 151*  < > 147*  K 3.2*  < > 2.8*  CL 118*  < > 118*  CO2 28  < > 23  GLUCOSE 110*  < > 97  BUN 17  < > 14  CREATININE 1.16  < > 1.10  CALCIUM 9.4  < > 8.7*  MG  --   < > 1.9  AST 33  --   --   ALT 9*  --   --   ALKPHOS 54  --   --   BILITOT 0.8  --   --   < > = values in this interval not displayed. ------------------------------------------------------------------------------------------------------------------  Cardiac Enzymes  Recent Labs Lab 08/14/15 1226  TROPONINI 0.09*   ------------------------------------------------------------------------------------------------------------------  RADIOLOGY:  Dg Chest 1 View  08/15/2015  CLINICAL DATA:  Dyspnea EXAM: CHEST 1 VIEW COMPARISON:  08/27/15 FINDINGS: There is significant improved aeration in the left hemi thorax when compared with the prior exam. Mild posterior pleural effusion is noted. Some central increased left perihilar density is noted which is likely related to focal infiltrate. Mild right basilar atelectasis is noted. Mild vascular congestion is noted IMPRESSION: Overall significant improvement in the film when compared with the prior exam. Near complete aeration of the left lung is noted. Electronically Signed   By: Alcide Clever M.D.   On: 08/15/2015 08:12   Dg Chest Port 1 View  2015-08-27  CLINICAL DATA:  Acute onset of respiratory distress and fever. Initial encounter. EXAM: PORTABLE CHEST 1 VIEW COMPARISON:  Chest radiograph performed  08/01/2015 FINDINGS: There is near complete opacification of the left hemithorax, likely reflecting a combination of large pleural effusion and airspace opacification. The right lung remains relatively clear. No pneumothorax is seen. There is underlying left-sided volume loss,  with leftward mediastinal shift. The mediastinum is not well assessed due to the pleural effusion. No acute osseous abnormalities are seen. IMPRESSION: Near complete opacification of the left hemithorax, likely reflecting a combination of large pleural effusion and airspace opacification. Underlying left-sided volume loss, with leftward mediastinal shift. Electronically Signed   By: Roanna RaiderJeffery  Chang M.D.   On: 08/23/2015 21:46    EKG:   Orders placed or performed during the hospital encounter of 08/07/2015  . ED EKG 12-Lead  . ED EKG 12-Lead  . EKG 12-Lead  . EKG 12-Lead    ASSESSMENT AND PLAN:   82 year old male with past medical history significant for hypertension, benign prostatic hyperplasia,, persistent hoarseness of voice presents from nursing home secondary to altered mental status and hypoxia.  #1 metabolic encephalopathy- could be from hypernatremia or hypoxia. Also from his sepsis. -Continue to monitor closely. CT of the head with no acute findings. - not sure what his baseline is. More alert but mumbling and moaning when touched- needs family discussion.  #2 acute hypoxic respiratory failure- on Bipap CXR with left sided collapse- atelectasis with repeated pneumonias vs mucus plug. -, minimal effusion per Intensivist who has used an ultrasound to check at bedside So, likely no indication for thoracentesis - Cont Bipap for now - CXR from this morning has shown improvement. Cont current management - On zosyn.  vanc has been discontinued as MRSA PCR is negative.  #3 Hypertension- with sepsis now, IV fluids, not on any BP meds  #4 failure to thrive- cont remeron.  #5 hypernatremia- changed fluids to D5 1/2 NS. Monitor Likely from poor po intake  #6 hypokalemia and hypomagnesemia- being replaced,   #7 sepsis- secondary to pneumonia likely In ICU, IV fluids, lactic acid elevated yesterday, f/u today On ABX, not on pressors yet  #8 chronic anemia- baseline hb around 9, close  to baseline now. Monitor  #9  DVT prophylaxis-Lovenox   Overall poor prognosis. palliative care consult placed.    All the records are reviewed and case discussed with Care Management/Social Workerr. Management plans discussed with the patient, family and they are in agreement.  CODE STATUS: Full code  TOTAL CRITICAL CARE TIME SPENT IN TAKING CARE OF THIS PATIENT: 38 minutes.   POSSIBLE D/C IN 2-3 DAYS, DEPENDING ON CLINICAL CONDITION.   Enid BaasKALISETTI,Emit Kuenzel M.D on 08/15/2015 at 8:33 AM  Between 7am to 6pm - Pager - [phone]  After 6pm go to [email or web address] - password [email or web address] [facility]  [facility] [facility]  Office  [phone]  CC: Primary care physician; Rafael BihariWALKER III, JOHN B, MD

## 2015-08-15 NOTE — Consult Note (Signed)
WOC wound consult note Reason for Consult: Unstageable pressure injury to sacrum, present on admission.  Wound type:Unstageable pressure injury Pressure Ulcer POA: Yes Measurement:4.2 cm x 4 cm unable to visualize wound bed due to devitalized tissue.  Bone is directly palpable.  Wound bed:100% devitalized tissue, detached from wound perimeter. Albumin 2.1 Drainage (amount, consistency, odor) Moderate serosanguinous drainage.  Musty odor.  Periwound:Intact Dressing procedure/placement/frequency:Cleanse sacral wound with NS and pat gently dry.  Apply calcium alginate dressing (Algisite) to wound bed.  Cover with 4x4 gauze and ABD pad/tape.  Change daily. Turn and reposition every 2 hours.  Will not follow at this time.  Please re-consult if needed.  Maple HudsonKaren Sigismund Cross RN BSN CWON Pager [phone]

## 2015-08-15 NOTE — Progress Notes (Signed)
Initial Nutrition Assessment     INTERVENTION:   Coordination of Care: await diet progression as medically able   NUTRITION DIAGNOSIS:   Inadequate oral intake related to acute illness as evidenced by NPO status.  GOAL:   Patient will meet greater than or equal to 90% of their needs  MONITOR:   Diet advancement, PO intake, Weight trends, I & O's, Labs  REASON FOR ASSESSMENT:   Malnutrition Screening Tool    ASSESSMENT:    Pt admitted with metabolic encephalopathy, acute respiratory failure currently on BiPap, HTN, FTT; mental status remains altered, not following commands, pt irritable and moaning  Past Medical History  Diagnosis Date  . Hypertension   . Arthritis   . HLD (hyperlipidemia)   . Renal failure   . BPH (benign prostatic hyperplasia)     Diet Order:  Diet NPO time specified Except for: Sips with Meds  Skin:   (unstageable pressure ulcer on coccyx)   Recent Labs Lab 09/01/2015 2124 08/14/15 0519 08/14/15 1226 08/14/15 1735 08/15/15 0345  NA 151* 150*  --   --  147*  K 3.2* 3.0*  --  3.1* 2.8*  CL 118* 120*  --   --  118*  CO2 28 27  --   --  23  BUN 17 16  --   --  14  CREATININE 1.16 1.09  --   --  1.10  CALCIUM 9.4 8.6*  --   --  8.7*  MG  --   --  1.6*  --  1.9  GLUCOSE 110* 104*  --   --  97    Glucose Profile:   Recent Labs  08/14/15 0009  GLUCAP 104*   Meds: D5-1/2 NS at 50 ml/hr, remeron, potassium chloride  Nutrition Focused Physical Exam:  Unable to complete Nutrition-Focused physical exam at this time.    Height:   Ht Readings from Last 1 Encounters:  08/02/2015 5\' 8"  (1.727 m)    Weight: 3.7% wt loss in <1 month per wt encounters  Wt Readings from Last 1 Encounters:  08/23/2015 157 lb 10.1 oz (71.5 kg)    Filed Weights   08/30/2015 2147  Weight: 157 lb 10.1 oz (71.5 kg)   Wt Readings from Last 10 Encounters:  08/18/2015 157 lb 10.1 oz (71.5 kg)  08/01/15 163 lb 4.8 oz (74.072 kg)  07/25/15 192 lb (87.091 kg)   07/14/15 184 lb 6.4 oz (83.643 kg)  07/04/15 173 lb 8 oz (78.699 kg)  06/25/15 163 lb (73.936 kg)  05/13/15 194 lb (87.998 kg)    BMI:  Body mass index is 23.97 kg/(m^2).  Estimated Nutritional Needs:   Kcal:  1800-2160 kcals   Protein:  96-108g (1.2-1.5 g/kg)  Fluid:  1800-2160 mL   HIGH Care Level  Romelle Starcherate Tatianna Ibbotson MS, RD, LDN [phone] Pager  [phone] Weekend/On-Call Pager

## 2015-08-16 ENCOUNTER — Ambulatory Visit: Payer: Self-pay | Admitting: *Deleted

## 2015-08-16 ENCOUNTER — Inpatient Hospital Stay: Payer: Commercial Managed Care - HMO

## 2015-08-16 LAB — BASIC METABOLIC PANEL
Anion gap: 4 — ABNORMAL LOW (ref 5–15)
BUN: 15 mg/dL (ref 6–20)
CALCIUM: 8.1 mg/dL — AB (ref 8.9–10.3)
CO2: 25 mmol/L (ref 22–32)
CREATININE: 1.01 mg/dL (ref 0.61–1.24)
Chloride: 115 mmol/L — ABNORMAL HIGH (ref 101–111)
GFR calc Af Amer: >60 mL/min (ref >60)
GLUCOSE: 87 mg/dL (ref 65–99)
Potassium: 3.2 mmol/L — ABNORMAL LOW (ref 3.5–5.1)
SODIUM: 144 mmol/L (ref 135–145)

## 2015-08-16 LAB — CBC
HCT: 28.1 % — ABNORMAL LOW (ref 40.0–52.0)
Hemoglobin: 8.9 g/dL — ABNORMAL LOW (ref 13.0–18.0)
MCH: 29.4 pg (ref 26.0–34.0)
MCHC: 31.6 g/dL — AB (ref 32.0–36.0)
MCV: 93.2 fL (ref 80.0–100.0)
PLATELETS: 89 10*3/uL — AB (ref 150–440)
RBC: 3.01 MIL/uL — AB (ref 4.40–5.90)
RDW: 16.3 % — AB (ref 11.5–14.5)
WBC: 16.4 10*3/uL — ABNORMAL HIGH (ref 3.8–10.6)

## 2015-08-16 LAB — POTASSIUM
POTASSIUM: 3.7 mmol/L (ref 3.5–5.1)
Potassium: 3.3 mmol/L — ABNORMAL LOW (ref 3.5–5.1)

## 2015-08-16 MED ORDER — MORPHINE SULFATE (PF) 2 MG/ML IV SOLN
1.0000 mg | INTRAVENOUS | Status: DC | PRN
  Administered 2015-08-17: 15:00:00 2 mg via INTRAVENOUS
  Filled 2015-08-16: qty 1

## 2015-08-16 MED ORDER — MORPHINE SULFATE (PF) 2 MG/ML IV SOLN: 1.0000 mg | INTRAVENOUS | Status: DC | PRN

## 2015-08-16 MED ORDER — POTASSIUM CHLORIDE 10 MEQ/100ML IV SOLN
10.0000 meq | INTRAVENOUS | Status: AC
  Administered 2015-08-16 (×3): 10 meq via INTRAVENOUS
  Filled 2015-08-16 (×3): qty 100

## 2015-08-16 NOTE — Progress Notes (Signed)
Pt is much improved and on [place] O2 without distress  Filed Vitals:   08/16/15 0900 08/16/15 1000 08/16/15 1100 08/16/15 1224  BP: 109/81 95/63 108/70 100/64  Pulse: 109 98  92  Temp:    98.4 F (36.9 C)  TempSrc:    Oral  Resp: 22 17 17 20   Height:      Weight:      SpO2: 100% 100% 100% 98%   NAD HEENT WNL No JVD noted Bilateral rhonchi, no wheezes Reg, no M Ext warm  BMP Latest Ref Rng 08/16/2015 08/16/2015 08/16/2015  Glucose 65 - 99 mg/dL - 87 -  BUN 6 - 20 mg/dL - 15 -  Creatinine 6.290.61 - 1.24 mg/dL - 5.281.01 -  Sodium 413135 - 145 mmol/L - 144 -  Potassium 3.5 - 5.1 mmol/L 3.7 3.2(L) 3.3(L)  Chloride 101 - 111 mmol/L - 115(H) -  CO2 22 - 32 mmol/L - 25 -  Calcium 8.9 - 10.3 mg/dL - 8.1(L) -    CBC Latest Ref Rng 08/16/2015 08/15/2015 08/14/2015  WBC 3.8 - 10.6 K/uL 16.4(H) 15.5(H) 14.4(H)  Hemoglobin 13.0 - 18.0 g/dL 2.4(M8.9(L) 0.1(U9.3(L) 2.7(O8.7(L)  Hematocrit 40.0 - 52.0 % 28.1(L) 28.6(L) 27.6(L)  Platelets 150 - 440 K/uL 89(L) 87(L) 100(L)    CXR: since 3/12, collapse of L ling has resolved. Persistent bibasilar AS disease c/w PNA and or atx  IMPRESSION: Acute hypoxic respiratory failure L ung collapse, resolved Bilateral LL AS dz, suspect PNA   PLAN/REC: DC PRN BiPAP DC nebulized mucomyst Cont nebulized bronchodilators for now Complete 7-10 days abx  PCCM will sign off. Please call if we can be of further assistance  Billy Fischeravid Simonds, MD PCCM service Mobile [phone] Pager [phone] 08/16/2015

## 2015-08-16 NOTE — Progress Notes (Signed)
MEDICATION RELATED CONSULT NOTE - Follow up  Pharmacy Consult for Electrolyte Indication: hypokalemia  No Known Allergies  Patient Measurements: Height: 5\' 8"  (172.7 cm) Weight: 157 lb 10.1 oz (71.5 kg) IBW/kg (Calculated) : 68.4   Vital Signs: Temp: 98.4 F (36.9 C) (03/15 1224) Temp Source: Oral (03/15 1224) BP: 100/64 mmHg (03/15 1224) Pulse Rate: 92 (03/15 1224) Intake/Output from previous day: 03/14 0701 - 03/15 0700 In: 2441.3 [I.V.:1141.3; IV Piggyback:1300] Out: -  Intake/Output from this shift:    Labs:  Recent Labs  02-19-16 2124 08/14/15 0519 08/14/15 1226 08/15/15 0345 08/16/15 0304  WBC 13.5* 14.4*  --  15.5* 16.4*  HGB 9.8* 8.7*  --  9.3* 8.9*  HCT 30.8* 27.6*  --  28.6* 28.1*  PLT 112* 100*  --  87* 89*  CREATININE 1.16 1.09  --  1.10 1.01  MG  --   --  1.6* 1.9  --   ALBUMIN 2.1*  --   --   --   --   PROT 6.3*  --   --   --   --   AST 33  --   --   --   --   ALT 9*  --   --   --   --   ALKPHOS 54  --   --   --   --   BILITOT 0.8  --   --   --   --    Estimated Creatinine Clearance: 52.7 mL/min (by C-G formula based on Cr of 1.01).  Assessment: 82 yo male with hypokalemia K 2.7--> 3.3 after 6 runs of KCl (10 mEq x6) Pt now on [place] but no oral meds being given 3/15 @ 0800 K=3.7  Goal of Therapy:  K 3.5-5.0  Plan:  K WNL no supplementation needed at this time. Recheck in AM  Akua Blethen D Joshlyn Beadle 08/16/2015,7:19 PM

## 2015-08-16 NOTE — Progress Notes (Signed)
Patient moved to room 112 by bed with Diannia RuderKara, NT.  Patient alert with eyes open when leaving ICU.  Off unit tele monitor on patient and verified with CCMD prior to patient leaving ICU.  2 of patient's sons visited this morning and aware that patient was moving to new room.

## 2015-08-16 NOTE — Progress Notes (Signed)
Report called to Chris, RN

## 2015-08-16 NOTE — Consult Note (Signed)
   [facility]   08/16/2015  Kathryne ErikssonClayton Bartleson 04/09/1929 [id number]  [facility] for chronic disease management services, patient is a re-admission. Patient has been engaged by a [facility] and assigned to a  [facility].This Hospital Liaison let the current [facility] nurse and [facility]- Social worker know patient currently inpatient. Our community based plan of care has focused on disease management and community resource support. Will follow with up inpatient Case Manager regarding any additional post hospital [facility]-Care Management needs. Of note, [facility] services does not replace or interfere with any services that are needed or arranged by inpatient case management or social work. For additional questions or referrals please contact:  Jniya Madara RN, BSN [facility]  Hospital Liaison  [phone]) Business Mobile [phone]) Toll free office

## 2015-08-16 NOTE — Clinical Social Work Note (Signed)
Pt transferred from ICU to 1C. CSW received hand off. Per report, pt's family is interested in [facility] if pt is to continue with [facility]. Palliative Care is following and a family meeting is tomorrow. CSW will continue to follow.   Dede QuerySarah Merville Hijazi, MSW, LCSW  Clinical Social Worker  [phone]

## 2015-08-16 NOTE — Evaluation (Signed)
Clinical/Bedside Swallow Evaluation Patient Details  Name: Edwin Sutton MRN: [id number] Date of Birth: 24-Feb-1929  Today's Date: 08/16/2015 Time: SLP Start Time (ACUTE ONLY): 1200 SLP Stop Time (ACUTE ONLY): 1300 SLP Time Calculation (min) (ACUTE ONLY): 60 min  Past Medical History:  Past Medical History  Diagnosis Date  . Hypertension   . Arthritis   . HLD (hyperlipidemia)   . Renal failure   . BPH (benign prostatic hyperplasia)    Past Surgical History:  Past Surgical History  Procedure Laterality Date  . Total hip arthroplasty Bilateral    HPI:  Pt is a 82 y.o. male w/ h/o HTN, renal failure, and other medical issues who presents after being found unresponsive at the nursing home where he stays. His oxygen level was low. EMS put him on CPAP, and brought him to the ED. Here he was found to have an elevated white blood cell count with tachypnea and tachycardia. Chest x-ray showed near total opacity of his left hemithorax. Sepsis protocol initiated. Pt has been recently admitted to this facility with weakness and encephalopathy. Patient was admitted here in the hospital about a month ago and again last week treated for pneumonia. There has been a steady decline again at the facility where he stays. He has been more lethargic with reduced appetite; FTT. Pt has been lethargic, confused and just weaned from BiPAP this morning. Pt awakened w/ stim., signs of confusion in following through w/ commands/instructions and required max. verbal cues. He was oriented to self mumbling intermittently when addressed. Pt does lack sufficient dentition. Pt is NPO.    Assessment / Plan / Recommendation Clinical Impression  Pt appears to present w/ such increased lethargy and decreased alertness that safety w/ po intake is greatly impacted. Pt required max. verbal/tactile cues to follow through w/ the swallowing tasks as he tended to close his eyes and fall asleep; bolus holding noted. When pt was cued  and/or swallowed trials of puree, no immediate, overt s/s of aspiration noted. Oral phase appeared grossly adequate for clearing the trials given time but overall decreased awareness when accepting po trials from utensil. Pt demo. decreased awareness overall for the task of swallowing. He is at increased risk for aspiration at this time and strict aspiration precautions are necessary. MD consulted and agreed w/ recommendation for initiation of a Dys. 1 diet w/ Pudding consistency liquids; strict aspiration precautions; meds in puree. Full feeding assistance.     Aspiration Risk  Severe aspiration risk    Diet Recommendation  Dys. 1 w/ Pudding consistency liquids; strict aspiration precautions and feeding assistance w/ all po's - stop feeding if any increased coughing or s/s of aspiration noted.   Medication Administration: Crushed with puree    Other  Recommendations Recommended Consults:  (palliative care consult) Oral Care Recommendations: Oral care BID;Staff/trained caregiver to provide oral care Other Recommendations: Order thickener from pharmacy;Prohibited food (jello, ice cream, thin soups);Remove water pitcher   Follow up Recommendations  Skilled Nursing facility (TBD)    Frequency and Duration min 3x week  2 weeks       Prognosis Prognosis for Safe Diet Advancement: Guarded Barriers to Reach Goals: Cognitive deficits;Severity of deficits      Swallow Study   General Date of Onset: 08-25-15 HPI: Pt is a 82 y.o. male w/ h/o HTN, renal failure, and other medical issues who presents after being found unresponsive at the nursing home where he stays. His oxygen level was low. EMS put him on CPAP, and  brought him to the ED. Here he was found to have an elevated white blood cell count with tachypnea and tachycardia. Chest x-ray showed near total opacity of his left hemithorax. Sepsis protocol initiated. Pt has been recently admitted to this facility with weakness and encephalopathy.  Patient was admitted here in the hospital about a month ago and again last week treated for pneumonia. There has been a steady decline again at the facility where he stays. He has been more lethargic with reduced appetite; FTT. Pt has been lethargic, confused and just weaned from BiPAP this morning. Pt awakened w/ stim., signs of confusion in following through w/ commands/instructions and required max. verbal cues. He was oriented to self mumbling intermittently when addressed. Pt does lack sufficient dentition. Pt is NPO.  Type of Study: Bedside Swallow Evaluation Previous Swallow Assessment: last admission on 08/03/15 Diet Prior to this Study:  (Dysphagia diet) Temperature Spikes Noted:  (101.7 earlier; wbc 16.4) Respiratory Status: Nasal cannula (2-3 liters) History of Recent Intubation:  (BiPAP) Behavior/Cognition: Lethargic/Drowsy;Requires cueing;Doesn't follow directions;Confused Oral Cavity Assessment: Dry (unable to fully assess) Oral Care Completed by SLP: Recent completion by staff Oral Cavity - Dentition: Missing dentition (few front lower teeth only) Self-Feeding Abilities: Total assist Patient Positioning: Upright in bed Baseline Vocal Quality: Low vocal intensity (mumbled speech only) Volitional Cough: Cognitively unable to elicit Volitional Swallow: Unable to elicit    Oral/Motor/Sensory Function Overall Oral Motor/Sensory Function:  (unable to fully assess sec. to declined Cognition)   Ice Chips Ice chips: Impaired Presentation: Spoon (2 trials) Oral Phase Impairments: Poor awareness of bolus Oral Phase Functional Implications: Prolonged oral transit Pharyngeal Phase Impairments: Suspected delayed Swallow Other Comments: slow follow through w/ swallowing   Thin Liquid Thin Liquid: Not tested    Nectar Thick Nectar Thick Liquid: Not tested   Honey Thick Honey Thick Liquid: Not tested   Puree Puree: Impaired Presentation: Spoon (10 trials total) Oral Phase Impairments:  Reduced labial seal;Poor awareness of bolus Oral Phase Functional Implications: Oral holding;Prolonged oral transit Pharyngeal Phase Impairments: Suspected delayed Swallow;Throat Clearing - Delayed (x2 not immediately related to the po trials) Other Comments: pt given verbal/tactile cues to complete swallowing, clearing.   Solid   GO   Solid: Not tested        Jerilynn SomKatherine Watson, MS, CCC-SLP  Watson,Katherine 08/16/2015,3:16 PM

## 2015-08-16 NOTE — Progress Notes (Signed)
MEDICATION RELATED CONSULT NOTE - Follow up  Pharmacy Consult for Electrolyte Indication: hypokalemia  No Known Allergies  Patient Measurements: Height: 5\' 8"  (172.7 cm) Weight: 157 lb 10.1 oz (71.5 kg) IBW/kg (Calculated) : 68.4   Vital Signs: Temp: 101 F (38.3 C) (03/15 0200) Temp Source: Axillary (03/15 0200) BP: 111/76 mmHg (03/15 0200) Pulse Rate: 96 (03/15 0200) Intake/Output from previous day: 03/14 0701 - 03/15 0700 In: 1841.3 [I.V.:891.3; IV Piggyback:950] Out: -  Intake/Output from this shift: Total I/O In: 800 [I.V.:350; IV Piggyback:450] Out: -   Labs:  Recent Labs  08/06/2015 2124 08/14/15 0519 08/14/15 1226 08/15/15 0345  WBC 13.5* 14.4*  --  15.5*  HGB 9.8* 8.7*  --  9.3*  HCT 30.8* 27.6*  --  28.6*  PLT 112* 100*  --  87*  CREATININE 1.16 1.09  --  1.10  MG  --   --  1.6* 1.9  ALBUMIN 2.1*  --   --   --   PROT 6.3*  --   --   --   AST 33  --   --   --   ALT 9*  --   --   --   ALKPHOS 54  --   --   --   BILITOT 0.8  --   --   --    Estimated Creatinine Clearance: 48.4 mL/min (by C-G formula based on Cr of 1.1).  Assessment: 82 yo male with hypokalemia K 2.7--> 3.3 after 6 runs of KCl (10 mEq x6) Pt now on [place] but no oral meds being given  Goal of Therapy:  K 3.5-5.0  Plan:  KCl 10 mEq IV x3 runs K check at 1800   Crist FatWang, Christinea Brizuela L 08/16/2015,2:37 AM

## 2015-08-16 NOTE — Progress Notes (Addendum)
[facility] [place] [facility]   PATIENT NAME: Edwin ErikssonClayton Sutton    MR#:  [id number]  DATE OF BIRTH:  12/11/1928  SUBJECTIVE:  CHIEF COMPLAINT:   Chief Complaint  Patient presents with  . Respiratory Distress   -   REVIEW OF SYSTEMS:  Review of Systems  Unable to perform ROS: critical illness    DRUG ALLERGIES:  No Known Allergies  VITALS:  Blood pressure 108/70, pulse 98, temperature 99.5 F (37.5 C), temperature source Axillary, resp. rate 17, height 5\' 8"  (1.727 m), weight 71.5 kg (157 lb 10.1 oz), SpO2 100 %.  PHYSICAL EXAMINATION:  Physical Exam  GENERAL:  82 y.o.-year-old patient lying in the bed, critically ill-appearing.  EYES: Pupils equal, round, reactive to light and accommodation. No scleral icterus. Extraocular muscles intact.  HEENT: Head atraumatic, normocephalic. Oropharynx and nasopharynx clear. BiPAP mask present NECK:  Supple, no jugular venous distention. No thyroid enlargement, no tenderness.  LUNGS: Coarse rhonchi bilaterally, decreased left basilar breath sounds. No wheezing or Rales heard.. No use of accessory muscles of respiration.  CARDIOVASCULAR: S1, S2 normal. No murmurs, rubs, or gallops.  ABDOMEN: Soft, nontender, nondistended. Bowel sounds present. No organomegaly or mass.  EXTREMITIES: No pedal edema, cyanosis, or clubbing.  NEUROLOGIC: Patient is encephalopathic and not able to follow any commands. Resting tremors noted. Alert to his name. Speech is not understandable, likely from the BiPAP. PSYCHIATRIC: The patient is alert but encephalopathic.  SKIN: No obvious rash, lesion, or ulcer.    LABORATORY PANEL:   CBC  Recent Labs Lab 08/16/15 0304  WBC 16.4*  HGB 8.9*  HCT 28.1*  PLT 89*   ------------------------------------------------------------------------------------------------------------------  Chemistries   Recent Labs Lab 06-23-15 2124  08/15/15 0345  08/16/15 0304 08/16/15 0801  NA  151*  < > 147*  --  144  --   K 3.2*  < > 2.8*  < > 3.2* 3.7  CL 118*  < > 118*  --  115*  --   CO2 28  < > 23  --  25  --   GLUCOSE 110*  < > 97  --  87  --   BUN 17  < > 14  --  15  --   CREATININE 1.16  < > 1.10  --  1.01  --   CALCIUM 9.4  < > 8.7*  --  8.1*  --   MG  --   < > 1.9  --   --   --   AST 33  --   --   --   --   --   ALT 9*  --   --   --   --   --   ALKPHOS 54  --   --   --   --   --   BILITOT 0.8  --   --   --   --   --   < > = values in this interval not displayed. ------------------------------------------------------------------------------------------------------------------  Cardiac Enzymes  Recent Labs Lab 08/14/15 1226  TROPONINI 0.09*   ------------------------------------------------------------------------------------------------------------------  RADIOLOGY:  Dg Chest 1 View  08/15/2015  CLINICAL DATA:  Dyspnea EXAM: CHEST 1 VIEW COMPARISON:  2015/08/05 FINDINGS: There is significant improved aeration in the left hemi thorax when compared with the prior exam. Mild posterior pleural effusion is noted. Some central increased left perihilar density is noted which is likely related to focal infiltrate. Mild right basilar atelectasis is noted. Mild vascular congestion is  noted IMPRESSION: Overall significant improvement in the film when compared with the prior exam. Near complete aeration of the left lung is noted. Electronically Signed   By: Edwin Sutton M.D.   On: 08/15/2015 08:12   Dg Chest Port 1 View  08/16/2015  CLINICAL DATA:  82 year old male history of respiratory failure. Renal failure. EXAM: PORTABLE CHEST 1 VIEW COMPARISON:  Multiple prior, most recent 08/15/2015. FINDINGS: Cardiomediastinal silhouette likely unchanged, though partially obscured by overlying lung and pleural disease. In the interval, there is development of right basilar airspace opacity, partially obscuring the right border and right hemidiaphragm. Persistent opacity at the left base  partially obscuring the left heart border and left hemidiaphragm. Overall the appearance is improved from 2015-08-20. IMPRESSION: Multifocal airspace disease, worsening at the right base and relatively unchanged at the left base. Findings concerning for ongoing multifocal pneumonia, with associated atelectasis and volume loss at the left base. Pleural effusion not excluded. Signed, Edwin Sutton. Edwin Ave, DO [facility]iology Electronically Signed   By: Edwin Sutton D.O.   On: 08/16/2015 08:03    EKG:   Orders placed or performed during the hospital encounter of 08-20-2015  . ED EKG 12-Lead  . ED EKG 12-Lead  . EKG 12-Lead  . EKG 12-Lead    ASSESSMENT AND PLAN:   82 year old male with past medical history significant for hypertension, benign prostatic hyperplasia,, persistent hoarseness of voice presents from nursing home secondary to altered mental status and hypoxia.  #1 Metabolic encephalopathy- could be from hypernatremia or hypoxia. Also from his sepsis. -Continue to monitor closely. CT of the head with no acute findings. Has an old stroke. - not sure what his baseline is. More alert but mumbling and drying and fighting when touched- needs family discussion.  #2 acute hypoxic respiratory failure- on Bipap initially in the ICU. Currently weaned down to nasal cannula. Fourth admission in the last couple of months for pneumonia. CXR with left sided collapse- atelectasis with repeated pneumonias vs mucus plug. -However with BiPAP, repeat chest x-ray shows much improvement., minimal effusion per Intensivist who has used an ultrasound to check at bedside -Also chest x-ray with multifocal pneumonia So, likely no indication for thoracentesis - On zosyn.  vanc has been discontinued as MRSA PCR is negative. -Overall poor prognosis as very deconditioned. Will benefit from palliative care and care.  #3 Hypertension- with sepsis now, IV fluids, not on  any BP meds BP low normal now. Hasn't required any pressors  #4 failure to thrive- cont remeron. Speech consult today  #5 hypernatremia- Improved on D5 1/2 NS. Monitor Likely from poor po intake  #6 hypokalemia- being replaced,   #7 sepsis- secondary to pneumonia likely In ICU, IV fluids, lactic acid elevated yesterday, f/u today On ABX, not on pressors yet  #8 chronic anemia- baseline hb around 9, close to baseline now. Monitor  #9  DVT prophylaxis-Lovenox   Overall poor prognosis. palliative care consult placed.    All the records are reviewed and case discussed with Care Management/Social Workerr. Management plans discussed with the patient, family and they are in agreement.  CODE STATUS: Full code.  TOTAL TIME SPENT IN TAKING CARE OF THIS PATIENT: 38 minutes.   POSSIBLE D/C IN 2-3 DAYS, DEPENDING ON CLINICAL CONDITION.   Enid Baas M.D on 08/16/2015 at 11:38 AM  Between 7am to 6pm - Pager - [phone]  After 6pm go to [email or web address] - Scientist, research (life sciences) Hospitalists  Office  [phone]  CC: Primary care physician; Rafael BihariWALKER III, JOHN B, MD

## 2015-08-16 NOTE — Progress Notes (Signed)
Palliative Care Update  I have spent about an hour with three of pts six adult children.  It is not clear whether Darlene (who was not here today but who works here in the ED in housekeeping) is HCPOA or not.  Pt lives with son, Luisa Hartatrick, who was here.  Others present included sons, Brett CanalesSteve and Iantha FallenKenneth, and a cousin and a very informed niece who works in Set designerbehavioral health.  Family was educated and I brought up issues pertaining to age, pts disease process, his gradual decline before coming here (he is cachectic) and his acute decline since the pneumonia developed and prompted the first hospital stay.  I spoke about a 'roller coaster' course --as opposed to 'turning the corner' --which is what family was thinking he had done last evening when he came off of BIPAP. Those present have a better understanding of the signs that pt is giving us that his time is winding down.  I brought up code status but did not push for an answer. I brought up intubation and feeding tubes.  There is some confusion about his code status--- but family seemed to think he was still supposed to be full code status (though they will talk amongst themselves and will consider a DNR).  I brought up pts loss of appetite and minimal intake.    Will meet again with family tomorrow and will call those not present.  If an HCPOA document exists, I will need to see it.   It was not appropriate to ask for decisions today --but I will be moving in that direction with family.  I have talked with nurse, attending, care mgr, and social worker about pt today.   See full note to follow.  Suan HalterMargaret F Angelik Walls, MD

## 2015-08-17 DIAGNOSIS — E876 Hypokalemia: Secondary | ICD-10-CM

## 2015-08-17 DIAGNOSIS — Z87891 Personal history of nicotine dependence: Secondary | ICD-10-CM

## 2015-08-17 DIAGNOSIS — D696 Thrombocytopenia, unspecified: Secondary | ICD-10-CM

## 2015-08-17 DIAGNOSIS — I10 Essential (primary) hypertension: Secondary | ICD-10-CM

## 2015-08-17 DIAGNOSIS — Z515 Encounter for palliative care: Secondary | ICD-10-CM

## 2015-08-17 DIAGNOSIS — J9600 Acute respiratory failure, unspecified whether with hypoxia or hypercapnia: Secondary | ICD-10-CM

## 2015-08-17 DIAGNOSIS — E860 Dehydration: Secondary | ICD-10-CM

## 2015-08-17 DIAGNOSIS — N179 Acute kidney failure, unspecified: Secondary | ICD-10-CM

## 2015-08-17 DIAGNOSIS — R1310 Dysphagia, unspecified: Secondary | ICD-10-CM

## 2015-08-17 DIAGNOSIS — J9819 Other pulmonary collapse: Secondary | ICD-10-CM

## 2015-08-17 LAB — BASIC METABOLIC PANEL
ANION GAP: 3 — AB (ref 5–15)
BUN: 16 mg/dL (ref 6–20)
CALCIUM: 8.3 mg/dL — AB (ref 8.9–10.3)
CHLORIDE: 117 mmol/L — AB (ref 101–111)
CO2: 26 mmol/L (ref 22–32)
CREATININE: 1.01 mg/dL (ref 0.61–1.24)
GFR calc non Af Amer: >60 mL/min (ref >60)
Glucose, Bld: 62 mg/dL — ABNORMAL LOW (ref 65–99)
Potassium: 2.9 mmol/L — CL (ref 3.5–5.1)
SODIUM: 146 mmol/L — AB (ref 135–145)

## 2015-08-17 MED ORDER — LORAZEPAM 2 MG/ML IJ SOLN
0.5000 mg | INTRAMUSCULAR | Status: DC | PRN
  Administered 2015-08-17: 0.5 mg via INTRAVENOUS
  Filled 2015-08-17: qty 1

## 2015-08-17 MED ORDER — LORAZEPAM 0.5 MG PO TABS: 0.5000 mg | ORAL_TABLET | ORAL | Status: AC | PRN

## 2015-08-17 MED ORDER — LORAZEPAM 0.5 MG PO TABS: 0.5000 mg | ORAL_TABLET | ORAL | Status: DC | PRN

## 2015-08-17 MED ORDER — VANCOMYCIN HCL 10 G IV SOLR
1250.0000 mg | INTRAVENOUS | Status: DC
  Filled 2015-08-17: qty 1250

## 2015-08-17 MED ORDER — POTASSIUM CHLORIDE 10 MEQ/100ML IV SOLN
10.0000 meq | INTRAVENOUS | Status: DC
  Filled 2015-08-17 (×2): qty 100

## 2015-08-17 MED ORDER — POTASSIUM CHLORIDE 10 MEQ/100ML IV SOLN
10.0000 meq | INTRAVENOUS | Status: DC
Start: 2015-08-17 — End: 2015-08-17
  Administered 2015-08-17: 10 meq via INTRAVENOUS
  Filled 2015-08-17: qty 100

## 2015-08-17 MED ORDER — KCL IN DEXTROSE-NACL 20-5-0.45 MEQ/L-%-% IV SOLN
INTRAVENOUS | Status: DC
  Administered 2015-08-17: 11:00:00 via INTRAVENOUS
  Filled 2015-08-17 (×2): qty 1000

## 2015-08-17 MED ORDER — PROCHLORPERAZINE 25 MG RE SUPP
25.0000 mg | Freq: Three times a day (TID) | RECTAL | Status: DC | PRN
  Filled 2015-08-17: qty 1

## 2015-08-17 MED ORDER — BISACODYL 10 MG RE SUPP: 10.0000 mg | Freq: Every day | RECTAL | Status: DC | PRN

## 2015-08-17 MED ORDER — MORPHINE SULFATE (CONCENTRATE) 10 MG/0.5ML PO SOLN: 5.0000 mg | ORAL | Status: AC | PRN

## 2015-08-17 MED ORDER — POTASSIUM CHLORIDE 10 MEQ/100ML IV SOLN
10.0000 meq | INTRAVENOUS | Status: AC
  Administered 2015-08-17 (×4): 10 meq via INTRAVENOUS
  Filled 2015-08-17 (×4): qty 100

## 2015-08-17 MED ORDER — MORPHINE SULFATE (CONCENTRATE) 10 MG/0.5ML PO SOLN
5.0000 mg | ORAL | Status: DC | PRN
  Administered 2015-08-17 (×2): 5 mg via SUBLINGUAL
  Filled 2015-08-17 (×2): qty 1

## 2015-08-17 MED ORDER — PROCHLORPERAZINE 25 MG RE SUPP: 25.0000 mg | Freq: Three times a day (TID) | RECTAL | Status: AC | PRN

## 2015-08-17 MED ORDER — IPRATROPIUM-ALBUTEROL 0.5-2.5 (3) MG/3ML IN SOLN: 3.0000 mL | RESPIRATORY_TRACT | Status: AC | PRN

## 2015-08-17 MED ORDER — ACETAMINOPHEN 650 MG RE SUPP: 650.0000 mg | Freq: Four times a day (QID) | RECTAL | Status: AC | PRN

## 2015-08-17 MED ORDER — VANCOMYCIN HCL 10 G IV SOLR
1250.0000 mg | Freq: Once | INTRAVENOUS | Status: AC
  Administered 2015-08-17: 11:00:00 1250 mg via INTRAVENOUS
  Filled 2015-08-17: qty 1250

## 2015-08-17 MED ORDER — BISACODYL 10 MG RE SUPP: 10.0000 mg | Freq: Every day | RECTAL | Status: AC | PRN

## 2015-08-17 MED ORDER — TAMSULOSIN HCL 0.4 MG PO CAPS: 0.4000 mg | ORAL_CAPSULE | Freq: Every day | ORAL | Status: AC

## 2015-08-18 ENCOUNTER — Other Ambulatory Visit: Payer: Self-pay | Admitting: *Deleted

## 2015-08-18 ENCOUNTER — Encounter: Payer: Self-pay | Admitting: *Deleted

## 2015-08-18 LAB — CULTURE, BLOOD (ROUTINE X 2)
Culture: NO GROWTH
Culture: NO GROWTH

## 2015-08-18 NOTE — Patient Outreach (Signed)
Plan to discharge pt from RN CM services as view in Epic, pt expired 3/16 while inpatient.    Plan to fax in Epic case closure letter  to Dr. Dan HumphreysWalker.   Plan to inform [facility] care management assistant) to close case.     Shayne Alkenose M.   Pierzchala RN CCM [facility] Care Management  [phone]

## 2015-09-02 NOTE — Progress Notes (Signed)
Please call daughter [name] [phone] [facility]. Bo McclintockBrewer,Aum Caggiano S, RN

## 2015-09-02 NOTE — Progress Notes (Signed)
Pharmacy Antibiotic Note Day 1 of Vancomycin and Day 4 of Zosyn therapy  Edwin ErikssonClayton Sutton is a 82 y.o. male admitted on 2016-03-17 with sepsis and persistent fevers. .  Pharmacy has been consulted for Vancomycin and Zoysn dosing. Patient was on Vancomycin therapy from 3/12 to 3/13.   Plan:  Patient estimated PK parameters Kel (hr-1): 0.048 Half-life (hrs): 14.44 Vd (liters): 50.05 (factor used: 0.7 L/kg)  Will start Vancomycin 1250 mg IV x 1 then Vancomycin 1250 mg IV q18 hours. Will order Vancomycin trough level to be drawn prior to the 03:00 dose of Vancomycin on 3/19. Targeting a trough level of 15-20 mcg/ml. Will continue to follow culture results and overall progress of patient and will recommend antimicrobial de-escalation when appropriate.   Zosyn: Continue Zosyn 3.375 g IV q 8 hours.    Height: 5\' 8"  (172.7 cm) Weight: 157 lb 10.1 oz (71.5 kg) IBW/kg (Calculated) : 68.4  Temp (24hrs), Avg:100 F (37.8 C), Min:98.4 F (36.9 C), Max:101.3 F (38.5 C)   Recent Labs Lab August 15, 2015 2121 August 15, 2015 2124 08/14/15 0012 08/14/15 0519 08/15/15 0345 08/15/15 0921 08/16/15 0304 08/31/2015 0424  WBC  --  13.5*  --  14.4* 15.5*  --  16.4*  --   CREATININE  --  1.16  --  1.09 1.10  --  1.01 1.01  LATICACIDVEN 3.1*  --  4.4*  --   --  2.3*  --   --     Estimated Creatinine Clearance: 52.7 mL/min (by C-G formula based on Cr of 1.01).    No Known Allergies  Antimicrobials this admission: Vacomycin  3/12 to 3/13 now restarted on 3/16 >>  Zosyn  3/13 >>  Dose adjustments this admission:   Microbiology results:  BCx: NGTD  UCx:    Sputum:    MRSA PCR: Negative   Thank you for allowing pharmacy to be a part of this patient's care.  Edwin Sutton D 08/09/2015 10:01 AM

## 2015-09-02 NOTE — Consult Note (Signed)
Palliative Medicine Inpatient Consult Note   Name: Edwin Sutton Date: 08/30/2015 MRN: [id number]  DOB: August 06, 1928  Referring Physician: Enid Baas, MD  Palliative Care consult requested for this 82 y.o. male for goals of medical therapy in patient with acute respiratory failure.  TODAY'S DISCUSSIONS AND PLANS:  I have spent about an hour with three of pts six adult children. 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 Pt lives with son, Edwin Sutton, who was here. Others present included sons, Edwin Sutton and Edwin Sutton, and a cousin and a very informed niece who works in Set designer.  Family was educated and I brought up issues pertaining to age, pts disease process, his gradual decline before coming here (he is cachectic) and his acute decline since the pneumonia developed and prompted the first hospital stay.  I spoke about a 'roller coaster' course --as opposed to 'turning the corner' --which is what family was thinking he had done last evening when he came off of BIPAP. Those present have a better understanding of the signs that pt is giving [place] that his time is winding down.  I brought up code status but did not push for an answer. I brought up intubation and feeding tubes. There is some confusion about his code status--- but family seemed to think he was still supposed to be full code status (though they will talk amongst themselves and will consider a DNR). I brought up pts loss of appetite and minimal intake.   Will meet again with family tomorrow and will call those not present. If an HCPOA document exists, I will need to see it. It was not appropriate to ask for decisions today --but I will be moving in that direction with family.  I have talked with nurse, attending, care mgr, and social worker about pt today.      BRIEF HISTORY: Pt has been living at his home with son, Edwin Sutton, living with him.   He could get around using a walker --taking a few steps independently until he started having recurrent pneumonias.  He is incontinent of B and B at home and wears briefs. Family says his appetite has gone down gradually over the past couple of months but more dramatically since the most recent pneumonia.   He had a pneumonia in January. Then he was in a facility and again had a recurrence of a left-sided pneumonia.  He was hospitalized on Feb 28th for the chief complaint of not eating or drinking at home and he was treated for left sided pneumonia.  He was discharged and then returned here on March 12th with the same pneumonia signs and symptoms and also an altered level of consciousness and a high fever of 104 degrees.  WBC was up and he was tachypneic and tachycardic.  CXR showed a near white-out of the left hemithorax.  Sepsis was identified.         IMPRESSION: Acute Resp Failure --was on BIPAP now off for 2 days Recurrent Pneumonia  --left sided --with lung collapse/ atelectasis (better after BIPAP but not nl yet) HTN Gait disorder (uses walker) BPH Incontinence of B and B  Former Smoker Acute renal failure Due to dehydration and ATN Dyslipidemia DJD --s/p hip arthroplasty Malnutrition --moderately severe with loss of appetite  ---albumin is 2.1 Paroxysmal Afib Unstageable Sacral Decubitus --present on admission  --4.2 by 4 cm with devitalized tissue and palpable bone Drainage  noted  Failure to Thrive Weakness  Dysphagia  ---was placed on Dys 2 with thins on Mar 2 (here) ---now on Dys 1 with pudding liquids and significant dysphagia Metabolic Encephalopathy Elevated troponins due to demand ischemia not ACS Hypokalemia Hypomagnesemia Chronic Anemia (of chronic disease ?) Elevated right Hemidiaphragm Mild thrombocytopenia   REVIEW OF SYSTEMS:  Patient is not able to provide ROS due to illness.  SPIRITUAL SUPPORT SYSTEM: Yes.  SOCIAL HISTORY:  reports that he has  quit smoking. He does not have any smokeless tobacco history on file. He reports that he does not drink alcohol or use illicit drugs.Pt lives with son, Edwin Hartatrick. Daughter, Edwin CreeDarlene, manages his affairs and MAY be HCPOA (she is not here now and rest of siblings are unsure on this matter--she works in housekeeping in the ED). Two other sons are here as well as a niece and a cousin.  He has been at [facility] recently. Family interested in [facility] if pt goes back to a SNF.   LEGAL DOCUMENTS:  None  CODE STATUS: Full code  PAST MEDICAL HISTORY: Past Medical History  Diagnosis Date  . Hypertension   . Arthritis   . HLD (hyperlipidemia)   . Renal failure   . BPH (benign prostatic hyperplasia)     PAST SURGICAL HISTORY:  Past Surgical History  Procedure Laterality Date  . Total hip arthroplasty Bilateral     ALLERGIES:  has No Known Allergies.  MEDICATIONS:  Current Facility-Administered Medications  Medication Dose Route Frequency Provider Last Rate Last Dose  . acetaminophen (TYLENOL) tablet 650 mg  650 mg Oral Q6H PRN Oralia Manisavid Willis, MD   650 mg at 08/16/15 2207   Or  . acetaminophen (TYLENOL) suppository 650 mg  650 mg Rectal Q6H PRN Oralia Manisavid Willis, MD   650 mg at 08/15/15 1002  . antiseptic oral rinse (CPC / CETYLPYRIDINIUM CHLORIDE 0.05%) solution 7 mL  7 mL Mouth Rinse q12n4p Oralia Manisavid Willis, MD   7 mL at 08/16/15 1723  . chlorhexidine (PERIDEX) 0.12 % solution 15 mL  15 mL Mouth Rinse BID Oralia Manisavid Willis, MD   15 mL at 08/16/15 2212  . dextrose 5 % and 0.45 % NaCl with KCl 20 mEq/L infusion   Intravenous Continuous Enid Baasadhika Kalisetti, MD 75 mL/hr at 03-29-16 1129    . enoxaparin (LOVENOX) injection 40 mg  40 mg Subcutaneous Q24H Shane CrutchPradeep Ramachandran, MD   40 mg at 08/16/15 1201  . ipratropium-albuterol (DUONEB) 0.5-2.5 (3) MG/3ML nebulizer solution 3 mL  3 mL Nebulization Q6H Shane CrutchPradeep Ramachandran, MD   3 mL at 03-29-16 0826  . mirtazapine (REMERON) tablet 7.5 mg  7.5 mg Oral  QHS Oralia Manisavid Willis, MD   7.5 mg at 08/16/15 2207  . morphine 2 MG/ML injection 1-2 mg  1-2 mg Intravenous Q4H PRN Enid Baasadhika Kalisetti, MD      . ondansetron (ZOFRAN) tablet 4 mg  4 mg Oral Q6H PRN Oralia Manisavid Willis, MD       Or  . ondansetron [facility](ZOFRAN) injection 4 mg  4 mg Intravenous Q6H PRN Oralia Manisavid Willis, MD      . pantoprazole (PROTONIX) injection 40 mg  40 mg Intravenous Q24H Enid Baasadhika Kalisetti, MD   40 mg at 08/16/15 1657  . piperacillin-tazobactam (ZOSYN) IVPB 3.375 g  3.375 g Intravenous 3 times per day Cy BlamerAllison K Tharakan, RPH   3.375 g at 03-29-16 [id number]  . sodium chloride flush (NS) 0.9 % injection 3 mL  3 mL Intravenous Q12H Oralia Manisavid Willis, MD  3 mL at August 23, 2015 1129  . tamsulosin (FLOMAX) capsule 0.4 mg  0.4 mg Oral QHS Oralia Manis, MD   0.4 mg at 08/16/15 2212  . vancomycin (VANCOCIN) 1,250 mg in sodium chloride 0.9 % 250 mL IVPB  1,250 mg Intravenous Once Enid Baas, MD   1,250 mg at 2015/08/23 1129  . vancomycin (VANCOCIN) 1,250 mg in sodium chloride 0.9 % 250 mL IVPB  1,250 mg Intravenous Q18H Enid Baas, MD        Vital Signs: BP 157/69 mmHg  Pulse 95  Temp(Src) 100.3 F (37.9 C) (Axillary)  Resp 22  Ht  (1.727 m)  Wt 71.5 kg (157 lb 10.1 oz)  BMI 23.97 kg/m2  SpO2 100% Filed Weights   08/18/2015 2147  Weight: 71.5 kg (157 lb 10.1 oz)    Estimated body mass index is 23.97 kg/(m^2) as calculated from the following:   Height as of this encounter:  (1.727 m).   Weight as of this encounter: 71.5 kg (157 lb 10.1 oz).  PERFORMANCE STATUS (ECOG) : 4 - Bedbound  PHYSICAL EXAM: Lethargic Talks some but hard to understand Not sure what he is hearing/ understanding but family says he does interact Eyes closed Mouth dry Neck w/o JVD or TM Hrt rrr no mgr Lungs with decreased BS bases Abd soft and NT Ext no mottling or cyanosis  Coordination of  Care greater than 50% of time: Yes  Time Spent: 

## 2015-09-02 NOTE — Care Management Important Message (Signed)
Important Message  Patient Details  Name: Edwin ErikssonClayton Cousin MRN: [id number] Date of Birth: 11/16/1928   Medicare Important Message Given:  Yes    Olegario MessierKathy A Chailyn Racette 07-11-15, 1:52 PM

## 2015-09-02 NOTE — Progress Notes (Signed)
[facility] [place] [facility]   PATIENT NAME: Edwin Sutton    MR#:  [id number]  DATE OF BIRTH:  February 04, 1929  SUBJECTIVE:  CHIEF COMPLAINT:   Chief Complaint  Patient presents with  . Respiratory Distress   - on nasal cannula, responding to name - appears tachypneic though on nasal cannula - ill appearing  REVIEW OF SYSTEMS:  Review of Systems  Unable to perform ROS: critical illness    DRUG ALLERGIES:  No Known Allergies  VITALS:  Blood pressure 157/69, pulse 95, temperature 100.3 F (37.9 C), temperature source Axillary, resp. rate 22, height  (1.727 m), weight 71.5 kg (157 lb 10.1 oz), SpO2 100 %.  PHYSICAL EXAMINATION:  Physical Exam  GENERAL:  82 y.o.-year-old patient lying in the bed, tachypneic and critically ill-appearing.  EYES: Pupils equal, round, reactive to light and accommodation. No scleral icterus. Extraocular muscles intact.  HEENT: Head atraumatic, normocephalic. Oropharynx and nasopharynx clear.  NECK:  Supple, no jugular venous distention. No thyroid enlargement, no tenderness.  LUNGS: Coarse rhonchi bilaterally, decreased left basilar breath sounds. No wheezing or Rales heard.. No use of accessory muscles of respiration.  CARDIOVASCULAR: S1, S2 normal. No murmurs, rubs, or gallops.  ABDOMEN: Soft, nontender, nondistended. Bowel sounds present. No organomegaly or mass.  EXTREMITIES: No pedal edema, cyanosis, or clubbing.  NEUROLOGIC: Patient is encephalopathic and not able to follow any commands. Resting tremors noted. Alert to his name. Speech is not understandable, likely from the BiPAP. PSYCHIATRIC: The patient is alert but encephalopathic.  SKIN: No obvious rash, lesion Sacral decub ulcer present, unstageable   LABORATORY PANEL:   CBC  Recent Labs Lab 08/16/15 0304  WBC 16.4*  HGB 8.9*  HCT 28.1*  PLT 89*    ------------------------------------------------------------------------------------------------------------------  Chemistries   Recent Labs Lab 08/08/2015 2124  08/15/15 0345  2015/09/14 0424  NA 151*  < > 147*  < > 146*  K 3.2*  < > 2.8*  < > 2.9*  CL 118*  < > 118*  < > 117*  CO2 28  < > 23  < > 26  GLUCOSE 110*  < > 97  < > 62*  BUN 17  < > 14  < > 16  CREATININE 1.16  < > 1.10  < > 1.01  CALCIUM 9.4  < > 8.7*  < > 8.3*  MG  --   < > 1.9  --   --   AST 33  --   --   --   --   ALT 9*  --   --   --   --   ALKPHOS 54  --   --   --   --   BILITOT 0.8  --   --   --   --   < > = values in this interval not displayed. ------------------------------------------------------------------------------------------------------------------  Cardiac Enzymes  Recent Labs Lab 08/14/15 1226  TROPONINI 0.09*   ------------------------------------------------------------------------------------------------------------------  RADIOLOGY:  Dg Chest Port 1 View  08/16/2015  CLINICAL DATA:  82 year old male history of respiratory failure. Renal failure. EXAM: PORTABLE CHEST 1 VIEW COMPARISON:  Multiple prior, most recent 08/15/2015. FINDINGS: Cardiomediastinal silhouette likely unchanged, though partially obscured by overlying lung and pleural disease. In the interval, there is development of right basilar airspace opacity, partially obscuring the right border and right hemidiaphragm. Persistent opacity at the left base partially obscuring the left heart border and left hemidiaphragm. Overall the appearance is improved from 08/18/2015. IMPRESSION: Multifocal  airspace disease, worsening at the right base and relatively unchanged at the left base. Findings concerning for ongoing multifocal pneumonia, with associated atelectasis and volume loss at the left base. Pleural effusion not excluded. Signed, Yvone NeuJaime S. Loreta AveWagner, DO [facility] Electronically  Signed   By: Gilmer MorJaime  Wagner D.O.   On: 08/16/2015 08:03    EKG:   Orders placed or performed during the hospital encounter of 08/21/2015  . ED EKG 12-Lead  . ED EKG 12-Lead  . EKG 12-Lead  . EKG 12-Lead    ASSESSMENT AND PLAN:   82 year old male with past medical history significant for hypertension, benign prostatic hyperplasia,, persistent hoarseness of voice presents from nursing home secondary to altered mental status and hypoxia.  #1 Metabolic encephalopathy- could be from hypernatremia or hypoxia. Also from his sepsis. -Continue to monitor closely. CT of the head with no acute findings. Has an old stroke. - alert and following some commands, occasional response noted.  #2 acute hypoxic respiratory failure- on Bipap initially in the ICU. Currently weaned down to nasal cannula. Fourth admission in the last couple of months for pneumonia. CXR with left sided collapse- atelectasis with repeated pneumonias vs mucus plug. -However with BiPAP, repeat chest x-ray shows much improvement., minimal effusion per Intensivist who has used an ultrasound to check at bedside -Also chest x-ray with multifocal pneumonia So, no indication for thoracentesis - On zosyn.  vanc has been discontinued as MRSA PCR is negative. -Persistent fevers, ID consult has been placed. We will add vancomycin back. -Overall poor prognosis as very deconditioned. Will benefit from palliative care and care.  #3 Hypertension- with sepsis now, IV fluids, not on any BP meds BP improving, monitor  #4 failure to thrive- cont remeron. Speech consulted- on sips of fluids and small bites  #5 hypernatremia- Improved on D5 1/2 NS. Monitor Likely from poor po intake  #6 hypokalemia- being replaced aggressively  #7 sepsis- secondary to pneumonia likely  IV fluids, On ABX, did not need pressors - recurrent pneumonia history Continues to spike fevers- ID consult placed  #8 chronic anemia- baseline hb around 9, close to  baseline now. Monitor  #9  DVT prophylaxis-Lovenox   Overall poor prognosis. palliative care consult placed.    All the records are reviewed and case discussed with Care Management/Social Workerr. Management plans discussed with the patient, family and they are in agreement.  CODE STATUS: Full code.   TOTAL TIME SPENT IN TAKING CARE OF THIS PATIENT: 38 minutes.   POSSIBLE D/C IN 2-3 DAYS, DEPENDING ON CLINICAL CONDITION.   Enid BaasKALISETTI,Dantrell Schertzer M.D on 2016-03-10 at 9:27 AM  Between 7am to 6pm - Pager - [phone]  After 6pm go to [email or web address] - password [email or web address] [email or web address]  [facility]  Office  [phone]  CC: Primary care physician; Rafael BihariWALKER III, JOHN B, MD

## 2015-09-02 NOTE — Clinical Social Work Note (Signed)
Pt will discharge to [facility]. CSW notified [facility] (where he came from) and [facility] (where family wanted him to go) of plan. Per Palliative Care MD, pt's family is aware and agreeable to discharge plan. Hospital Liaison made arrangements for discharge to [facility]. RNCM prepared discharge packet for transfer. CSW is signing off as no furhter needs identified.   Dede QuerySarah Rickayla Wieland, MSW, LCSW  Clinical Social Worker [phone]

## 2015-09-02 NOTE — Progress Notes (Signed)
MEDICATION RELATED CONSULT NOTE - Follow up  Pharmacy Consult for Electrolyte Indication: hypokalemia  No Known Allergies  Patient Measurements: Height: 5\' 8"  (172.7 cm) Weight: 157 lb 10.1 oz (71.5 kg) IBW/kg (Calculated) : 68.4   Vital Signs: Temp: 100.3 F (37.9 C) (03/16 0502) Temp Source: Axillary (03/16 0502) BP: 157/69 mmHg (03/16 0502) Pulse Rate: 95 (03/16 0502) Intake/Output from previous day: 03/15 0701 - 03/16 0700 In: 30 [I.V.:30] Out: -  Intake/Output from this shift:    Labs:  Recent Labs  08/14/15 1226 08/15/15 0345 08/16/15 0304 08/02/2015 0424  WBC  --  15.5* 16.4*  --   HGB  --  9.3* 8.9*  --   HCT  --  28.6* 28.1*  --   PLT  --  87* 89*  --   CREATININE  --  1.10 1.01 1.01  MG 1.6* 1.9  --   --    Estimated Creatinine Clearance: 52.7 mL/min (by C-G formula based on Cr of 1.01).  Assessment: 82 yo male with hypokalemia K= 2.9.   Goal of Therapy:  K 3.5-5.0  Plan:  Patient supplemented by night shift MD with 10 mEq  Of KCl x 4 and then by [facility] another order of KCl 10 mEq x 2. Potassium recheck ordered for 14:00. Magnesium add on lab added. Will follow up on result and will reassess for need for further supplementation.   Bastian Andreoli D 08/07/2015,9:59 AM

## 2015-09-02 NOTE — Discharge Summary (Signed)
[facility] - [place] at [facility]   PATIENT NAME: Edwin Sutton    MR#:  [id number]  DATE OF BIRTH:  09-17-1928  DATE OF ADMISSION:  09-09-15 ADMITTING PHYSICIAN: Oralia Manis, MD  DATE OF DISCHARGE: 08/14/2015  PRIMARY CARE PHYSICIAN: Rafael Bihari, MD    ADMISSION DIAGNOSIS:  Lactic acidosis [E87.2] Pleural effusion [J90] Healthcare-associated pneumonia [J18.9] Sepsis, due to unspecified organism (HCC) [A41.9]  DISCHARGE DIAGNOSIS:  Principal Problem:   Sepsis (HCC) Active Problems:   Benign essential HTN   Paroxysmal atrial fibrillation (HCC)   HCAP (healthcare-associated pneumonia)   BPH (benign prostatic hyperplasia)   Hypernatremia   Acute respiratory failure with hypoxemia (HCC)   Pleural effusion, left   Dyspnea   SECONDARY DIAGNOSIS:   Past Medical History  Diagnosis Date  . Hypertension   . Arthritis   . HLD (hyperlipidemia)   . Renal failure   . BPH (benign prostatic hyperplasia)     HOSPITAL COURSE:   82 year old male with past medical history significant for hypertension, benign prostatic hyperplasia,, persistent hoarseness of voice presents from nursing home secondary to altered mental status and hypoxia.  Patient presented with acute hypoxic respiratory failure from her nursing home, admitted to ICU and was on BiPAP. Did not significantly improve. This is his fourth admission for pneumonia in the last couple of months. He had significant lung collapse on the left side when he presented. With BiPAP that improved but chest x-ray still shows multifocal pneumonia. Patient is clinically looking worse. Continues to have fevers. Also significantly malnourished and cachectic. Failure to thrive and showing a sign off approaching end-of-life.  Appreciate palliative care consult. Dr. Orvan Falconer has discussed with several family members and they have agreed for comfort care and requested hospice home placement. Patient will be going  to hospice home today. His final diagnosis are:  - acute hypoxic respiratory failure - Multifocal pneumonia - Sepsis - Metabolic encephalopathy - Hypernatremia - Hypokalemia - Failure to thrive - Anemia of chronic disease -Dementia -Hypertension -Unstageable sacral decub ulcer. -Severe malnutrition -Acute renal failure -Tremors  Patient will be discharged to hospice home today.   DISCHARGE CONDITIONS:   Critical  CONSULTS OBTAINED:  Treatment Team:  Clydie Braun, MD  DRUG ALLERGIES:  No Known Allergies  DISCHARGE MEDICATIONS:   Current Discharge Medication List    START taking these medications   Details  acetaminophen (TYLENOL) 650 MG suppository Place 1 suppository (650 mg total) rectally every 6 (six) hours as needed for mild pain (or fever). Qty: 12 suppository, Refills: 0    bisacodyl (DULCOLAX) 10 MG suppository Place 1 suppository (10 mg total) rectally daily as needed for moderate constipation. Qty: 6 suppository, Refills: 0    LORazepam (ATIVAN) 0.5 MG tablet Take 1 tablet (0.5 mg total) by mouth every 4 (four) hours as needed (anxiety or tremors). Qty: 15 tablet, Refills: 0    Morphine Sulfate (MORPHINE CONCENTRATE) 10 MG/0.5ML SOLN concentrated solution Place 0.25 mLs (5 mg total) under the tongue every hour as needed for moderate pain, severe pain or shortness of breath. Qty: 30 mL, Refills: 0    prochlorperazine (COMPAZINE) 25 MG suppository Place 1 suppository (25 mg total) rectally every 8 (eight) hours as needed for nausea or vomiting. Qty: 6 suppository, Refills: 0      CONTINUE these medications which have CHANGED   Details  ipratropium-albuterol (DUONEB) 0.5-2.5 (3) MG/3ML SOLN Take 3 mLs by nebulization every 4 (four) hours as needed. Qty: 15 mL, Refills:  0    tamsulosin (FLOMAX) 0.4 MG CAPS capsule Take 1 capsule (0.4 mg total) by mouth at bedtime. Qty: 15 capsule, Refills: 0   Associated Diagnoses: Urinary retention      STOP  taking these medications     Amino Acids-Protein Hydrolys (FEEDING SUPPLEMENT, PRO-STAT SUGAR FREE 64,) LIQD      cefTRIAXone 1 g in dextrose 5 % 50 mL      doxycycline (VIBRAMYCIN) 100 MG capsule      guaifenesin (ROBITUSSIN) 100 MG/5ML syrup      mirtazapine (REMERON) 7.5 MG tablet      Multiple Vitamin (MULTIVITAMIN WITH MINERALS) TABS tablet      Nutritional Supplements (ENSURE PLUS PO)      potassium chloride (KLOR-CON) 20 MEQ packet      Potassium Chloride in Dextrose (DEXTROSE 5 % WITH KCL 20 MEQ / L) 20-5 MEQ/L-%      potassium chloride in dextrose 5 % and 0.45% NaCl 1,000 mL          DISCHARGE INSTRUCTIONS:   Patient will be transferred to hospice home today   If you experience worsening of your admission symptoms, develop shortness of breath, life threatening emergency, suicidal or homicidal thoughts you must seek medical attention immediately by calling 911 or calling your MD immediately  if symptoms less severe.  You Must read complete instructions/literature along with all the possible adverse reactions/side effects for all the Medicines you take and that have been prescribed to you. Take any new Medicines after you have completely understood and accept all the possible adverse reactions/side effects.   Please note  You were cared for by a hospitalist during your hospital stay. If you have any questions about your discharge medications or the care you received while you were in the hospital after you are discharged, you can call the unit and asked to speak with the hospitalist on call if the hospitalist that took care of you is not available. Once you are discharged, your primary care physician will handle any further medical issues. Please note that NO REFILLS for any discharge medications will be authorized once you are discharged, as it is imperative that you return to your primary care physician (or establish a relationship with a primary care physician if you do  not have one) for your aftercare needs so that they can reassess your need for medications and monitor your lab values.    Today   CHIEF COMPLAINT:   Chief Complaint  Patient presents with  . Respiratory Distress    VITAL SIGNS:  Blood pressure 157/69, pulse 95, temperature 100.3 F (37.9 C), temperature source Axillary, resp. rate 22, height 5\' 8"  (1.727 m), weight 71.5 kg (157 lb 10.1 oz), SpO2 95 %.  I/O:   Intake/Output Summary (Last 24 hours) at 08/06/2015 1542 Last data filed at 08/11/2015 1412  Gross per 24 hour  Intake 203.75 ml  Output      0 ml  Net 203.75 ml    PHYSICAL EXAMINATION:   Physical Exam  GENERAL: 82 y.o.-year-old patient lying in the bed, tachypneic and critically ill-appearing.  EYES: Pupils equal, round, reactive to light and accommodation. No scleral icterus. Extraocular muscles intact.  HEENT: Head atraumatic, normocephalic. Oropharynx and nasopharynx clear.  NECK: Supple, no jugular venous distention. No thyroid enlargement, no tenderness.  LUNGS: Coarse rhonchi bilaterally, decreased left basilar breath sounds. No wheezing or Rales heard.. No use of accessory muscles of respiration.  CARDIOVASCULAR: S1, S2 normal. No murmurs, rubs,  or gallops.  ABDOMEN: Soft, nontender, nondistended. Bowel sounds present. No organomegaly or mass.  EXTREMITIES: No pedal edema, cyanosis, or clubbing.  NEUROLOGIC: Patient is encephalopathic and not able to follow most commands. Resting tremors noted. Answered a yes or no question and then went back to sleeping. Alert to his name. Speech is not understandable,  PSYCHIATRIC: The patient is alert but encephalopathic.  SKIN: No obvious rash, lesion Sacral decub ulcer present, unstageable  DATA REVIEW:   CBC  Recent Labs Lab 08/16/15 0304  WBC 16.4*  HGB 8.9*  HCT 28.1*  PLT 89*    Chemistries   Recent Labs Lab 26-Aug-2015 2124  08/15/15 0345  08/03/2015 0424  NA 151*  < > 147*  < > 146*  K 3.2*   < > 2.8*  < > 2.9*  CL 118*  < > 118*  < > 117*  CO2 28  < > 23  < > 26  GLUCOSE 110*  < > 97  < > 62*  BUN 17  < > 14  < > 16  CREATININE 1.16  < > 1.10  < > 1.01  CALCIUM 9.4  < > 8.7*  < > 8.3*  MG  --   < > 1.9  --   --   AST 33  --   --   --   --   ALT 9*  --   --   --   --   ALKPHOS 54  --   --   --   --   BILITOT 0.8  --   --   --   --   < > = values in this interval not displayed.  Cardiac Enzymes  Recent Labs Lab 08/14/15 1226  TROPONINI 0.09*    Microbiology Results  Results for orders placed or performed during the hospital encounter of August 26, 2015  Blood Culture (routine x 2)     Status: None (Preliminary result)   Collection Time: Aug 26, 2015  9:24 PM  Result Value Ref Range Status   Specimen Description BLOOD LEFT HAND  Final   Special Requests BOTTLES DRAWN AEROBIC AND ANAEROBIC 4CCAERO,1CCANA  Final   Culture NO GROWTH 4 DAYS  Final   Report Status PENDING  Incomplete  Blood Culture (routine x 2)     Status: None (Preliminary result)   Collection Time: August 26, 2015  9:24 PM  Result Value Ref Range Status   Specimen Description BLOOD RIGHT HAND  Final   Special Requests BOTTLES DRAWN AEROBIC AND ANAEROBIC 2CCAERO,4CCANA  Final   Culture NO GROWTH 4 DAYS  Final   Report Status PENDING  Incomplete  Urine culture     Status: None   Collection Time: 08-26-2015 10:18 PM  Result Value Ref Range Status   Specimen Description URINE, RANDOM  Final   Special Requests NONE  Final   Culture NO GROWTH 2 DAYS  Final   Report Status 08/15/2015 FINAL  Final  MRSA PCR Screening     Status: None   Collection Time: 08/26/2015 11:47 PM  Result Value Ref Range Status   MRSA by PCR NEGATIVE NEGATIVE Final    Comment:        The GeneXpert MRSA Assay (FDA approved for NASAL specimens only), is one component of a comprehensive MRSA colonization surveillance program. It is not intended to diagnose MRSA infection nor to guide or monitor treatment for MRSA infections.      RADIOLOGY:  Dg Chest Port 1 View  08/16/2015  CLINICAL  DATA:  82 year old male history of respiratory failure. Renal failure. EXAM: PORTABLE CHEST 1 VIEW COMPARISON:  Multiple prior, most recent 08/15/2015. FINDINGS: Cardiomediastinal silhouette likely unchanged, though partially obscured by overlying lung and pleural disease. In the interval, there is development of right basilar airspace opacity, partially obscuring the right border and right hemidiaphragm. Persistent opacity at the left base partially obscuring the left heart border and left hemidiaphragm. Overall the appearance is improved from 08/20/2015. IMPRESSION: Multifocal airspace disease, worsening at the right base and relatively unchanged at the left base. Findings concerning for ongoing multifocal pneumonia, with associated atelectasis and volume loss at the left base. Pleural effusion not excluded. Signed, Yvone Neu. Loreta Ave, DO [facility] Rad[facility] Electronically Signed   By: Gilmer Mor D.O.   On: 08/16/2015 08:03    EKG:   Orders placed or performed during the hospital encounter of 08/27/2015  . ED EKG 12-Lead  . ED EKG 12-Lead  . EKG 12-Lead  . EKG 12-Lead      Management plans discussed with the patient, family and they are in agreement.  CODE STATUS: DO NOT RESUSCITATE    Code Status Orders        Start     Ordered   21-Aug-2015 1421  Do not attempt resuscitation (DNR)   Continuous    Question Answer Comment  In the event of cardiac or respiratory ARREST Do not call a "code blue"   In the event of cardiac or respiratory ARREST Do not perform Intubation, CPR, defibrillation or ACLS   In the event of cardiac or respiratory ARREST Use medication by any route, position, wound care, and other measures to relive pain and suffering. May use oxygen, suction and manual treatment of airway obstruction as needed for comfort.      08/21/15 1420    Code Status History     Date Active Date Inactive Code Status Order ID Comments User Context   08/16/2015  2:12 PM August 21, 2015  2:16 PM Full Code [id number]  Enid Baas, MD Inpatient   08/14/2015  2:40 PM 08/16/2015  2:12 PM DNR [id number]  Shane Crutch, MD Inpatient   08/14/2015 12:08 AM 08/14/2015  2:40 PM Full Code [id number]  Oralia Manis, MD Inpatient   08/01/2015 11:47 PM 08/04/2015  3:15 PM Full Code [id number]  Oralia Manis, MD Inpatient   07/13/2015 12:50 AM 07/14/2015  3:44 PM Full Code [id number]  Milagros Loll, MD ED   06/19/2015  5:00 PM 06/26/2015  4:26 PM Full Code [id number]  Katha Hamming, MD ED      TOTAL TIME TAKING CARE OF THIS PATIENT: 38 minutes.    Enid Baas M.D on 21-Aug-2015 at 3:42 PM  Between 7am to 6pm - Pager - [phone]  After 6pm go to [email or web address] - password [email or web address] [facility]  [facility]  Office  [phone]  CC: Primary care physician; Rafael Bihari, MD

## 2015-09-02 NOTE — Progress Notes (Signed)
Awaiting EMS transport. Edwin Sutton,Edwin Brum S, RN

## 2015-09-02 NOTE — Plan of Care (Signed)
Problem: SLP Dysphagia Goals Goal: Misc Dysphagia Goal Pt will safely tolerate po diet of least restrictive consistency w/ no overt s/s of aspiration noted by Staff/pt/family x3 sessions.    

## 2015-09-02 NOTE — Progress Notes (Signed)
New hospice home referral received from [facility] following a Palliative Care consult with Dr. Megan Salon. Mr. Edwin Sutton is an 82 year old man admitted to [facility] from [facility] care for treatment of recurrent pneumonia, requiring Bipap and ICU admission. He has continued to decline despite medical interventions. Family met today with Dr. Megan Salon and decided to focus on his comfort with transition to the hospice home. Patient is currently on nasal cannula at 2 liters. Patient seen lying in bed, did not respond to voice or touch, increased work of breathing noted, staff RN Ok Edwards notified and gave PRN liquid morphine as ordered. Writer met in the family room with patient's daughter Edwin Sutton, all of her siblings in agreement with transfer to the hospice home, determined prior in the meeting with Dr. Megan Salon. Education initiated regarding hospice services, philosophy and team approach to care with good understanding voiced. Questions answered, consents signed. Patient information faxed to referral intake. Hospital care team all aware of and in agreement with transfer today to the hospice home. Signed portable DNR in place in patient's discharge packet. Thank you for the opportunity to be involved in the care of this patient. Flo Shanks. RN, BSN, [place] [facility] [place], [facility] [phone] c

## 2015-09-02 NOTE — Care Management (Signed)
Discharge to Hospice Home today per Dr. Nemiah CommanderKalisetti. Family members are in agreement. [facility] per [facility]. Gwenette GreetBrenda S Richard Ritchey RN MSN CCM Care Management [phone]

## 2015-09-02 NOTE — Progress Notes (Signed)
Mr. Edwin Sutton had been at [facility] on 08/16/15 and 08/08/2015 visiting his grandfather [name] who has been admitted here for an acute medical condition. So please excuse him from work for those days.  Please call if any questions.   Thank you.  [name], MD [facility] [address], [place], [place] [address] Ph: [phone]

## 2015-09-02 NOTE — Progress Notes (Signed)
Palliative Medicine Inpatient Consult Follow Up Note   Name: Edwin Sutton Date: 08/24/2015 MRN: [id number]  DOB: 1929-01-14  Referring Physician: Enid Baas, MD  Palliative Care consult requested for this 82 y.o. male for goals of medical therapy in patient with acute respiratory failure.   TODAY'S DISCUSSIONS AND DECISIONS: Basically all available family members came in to have a conversation with me today about goals of care for pt.  Five of pt's six adult children were present (including Edwin Sutton and Edwin Sutton).  Additionally, there were grandchildren, great-grandchildren, and in-laws.  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 --his malnutrition, loss of appetite, cachexia, etc   ---hasn't been eating well for months   ---now is not eating much at all --only bites or sips   ---this is a sign of him approaching end of life   ---he does not carry a diagnosis of dementia but he does have it       (an ED note from Dec 2016 confirms he was brought there for visual hallucinations and the MD rec pt be considered to have dementia vs delerium).  Pt barely talks now --acts like he wants to say something but cannot.  Imaging shows atrophy of brain.          Family was very appreciative of explanations of the gradual approach to a dying phase.  They understand that pt is tired and though he isn't  exactly in severe pain and may not be obviously suffering, he still has a degree of suffering going on with his bedbound status, decubitus, shortness of breath, tremors, inability to communicate (he seems to have an aphasia --wants to say something but cannot) etc.  They understand that the Roxanol is for air hunger and also pain.  They requested he get something now b/c he looks uncomfortable --and this is ordered.   Family first agree with DNR and then they all (one by one) agreed with a comfort care approach. Then, after much discussion and options presented for disposition (with or without hospice care and various settings) family asked for an evaluation for possible Hospice Home placement.  I do think pt is appropriate and that he has a high likelihood of not being alive 3 weeks from now. Daughter, Edwin Sutton, hopes she has some days to spend with him at [facility] and I think she will, but he is not eating and cannot be maintained of IV fluids like we have had going here.    Pt does get tremulous at times and Ativan is ordered.  He might need a higher dose if what is ordered is not effective. He grimaces and has pain also.  His deubitus needs routine care.  He wears nasal cannula O2.  He takes Flomax in  order to urinate and can still take SOME pills .  His diet is dysphagia 1 with pudding thick liquids.    Family has gone to lunch but they have elected DARLENE to be the FIRST CONTACT and PATRICK is the back-up or SECOND CONTACT.  They are all  IMPRESSION:  IMPRESSION: Acute Resp Failure --was on BIPAP now off for 2 days Recurrent Pneumonia  --left sided --with lung collapse/ atelectasis (better after BIPAP but not nl yet) HTN Gait disorder (uses walker) BPH Incontinence of B and B  Former Smoker Acute renal failure Due to dehydration and ATN Dyslipidemia DJD --s/p hip arthroplasty Malnutrition --moderately severe with loss of appetite  ---albumin is 2.1 Paroxysmal  Afib Unstageable Sacral Decubitus --present on admission  --4.2 by 4 cm with devitalized tissue and palpable bone Drainage noted  Failure to Thrive Weakness  Dysphagia  ---was placed on Dys 2 with thins on Mar 2 (here) ---now on Dys 1 with pudding liquids and significant dysphagia Metabolic Encephalopathy Elevated troponins due to demand ischemia not ACS Hypokalemia Hypomagnesemia Chronic Anemia (of chronic disease ?) Elevated right Hemidiaphragm Mild thrombocytopenia Dementia --unclear type Tremors   REVIEW OF SYSTEMS:  Patient is not able to provide ROS due to illness and dementia  CODE STATUS: DNR  --this is now clarified again with family and they all agree with DNR.  Order is changed to DNR.   PAST MEDICAL HISTORY: Past Medical History  Diagnosis Date  . Hypertension   . Arthritis   . HLD (hyperlipidemia)   . Renal failure   . BPH (benign prostatic hyperplasia)     PAST SURGICAL HISTORY:  Past Surgical History  Procedure Laterality Date  . Total hip arthroplasty Bilateral     Vital Signs: BP 157/69 mmHg  Pulse 95  Temp(Src) 100.3 F (37.9 C) (Axillary)  Resp 22  Ht  (1.727 m)  Wt 71.5 kg (157 lb 10.1 oz)  BMI 23.97 kg/m2  SpO2 95% Filed Weights   08/12/2015 2147  Weight: 71.5 kg (157 lb 10.1 oz)    Estimated body mass index is 23.97 kg/(m^2) as calculated from the following:   Height as of this encounter:  (1.727 m).   Weight as of this encounter: 71.5 kg (157 lb 10.1 oz).  PHYSICAL EXAM: Restless with head resting against railing  --occasionally has hand tremors at rest Temporal wasting is noted Eyes are open at times but closed now Nares patent OP dry Neck w/o JVD or TM Hrt rrr with irreg beats heard He has a growth c/w lipoma or similar lesion on anterior chest wall Lungs with decreased BS bilaterally and ronchi are heard as well Abd soft and flat and w/ nl BS Ext no cyanosis or mottling Cachectic  LABS: CBC:     Component Value Date/Time   WBC 16.4* 08/16/2015 0304   WBC 5.0 11/18/2012 1032   HGB 8.9* 08/16/2015 0304   HGB 10.4* 12/05/2012 0548   HCT 28.1* 08/16/2015 0304   HCT 42.3 11/18/2012 1032   PLT 89* 08/16/2015 0304   PLT 79* 12/05/2012 0548   MCV 93.2 08/16/2015 0304   MCV 89 11/18/2012 1032   NEUTROABS 12.1* 08/11/2015 2124   NEUTROABS 3.9 04/28/2012 1534   LYMPHSABS 0.8* 08/21/2015 2124   LYMPHSABS 3.0 04/28/2012 1534   MONOABS 0.6 08/29/2015 2124   MONOABS 0.5 04/28/2012 1534   EOSABS 0.0 09/01/2015 2124   EOSABS 0.1 04/28/2012 1534   BASOSABS 0.0 08/18/2015 2124   BASOSABS 0.1  04/28/2012 1534   Comprehensive Metabolic Panel:    Component Value Date/Time   NA 146* 2016-01-23 0424   NA 139 12/07/2012 0505   K 2.9* 2016-01-23 0424   K 3.2* 12/07/2012 0505   CL 117* 2016-01-23 0424   CL 106 12/07/2012 0505   CO2 26 2016-01-23 0424   CO2 27 12/07/2012 0505   BUN 16 2016-01-23 0424   BUN 10 12/07/2012 0505   CREATININE 1.01 2016-01-23 0424   CREATININE 0.93 12/07/2012 0505   GLUCOSE 62* 2016-01-23 0424   GLUCOSE 87 12/07/2012 0505   CALCIUM 8.3* 2016-01-23 0424   CALCIUM 8.3* 12/07/2012 0505   AST 33 08/20/2015 2124   AST 19 05/06/2012 1437   ALT 9* 08/22/2015 2124   ALT 17 05/06/2012 1437   ALKPHOS 54 08/19/2015 2124   ALKPHOS 71 05/06/2012 1437   BILITOT 0.8 08/25/2015 2124   BILITOT 1.2* 05/06/2012 1437   PROT 6.3* 08/27/2015 2124   PROT 7.7 05/06/2012 1437   ALBUMIN 2.1* 08/08/2015 2124   ALBUMIN 3.3* 05/06/2012 1437     More than 50% of the visit was spent in counseling/coordination of care: YES  Time Spent:  65 min

## 2015-09-02 NOTE — Progress Notes (Signed)
1955 Called into room by hospice nurse.  Assessed pt and he was pulseless and not breathing.  Pt expired at 1955.  Kasey RN was second nurse to verify pt had expired.  Hospice RN called pt's family and they are on the way.  2225 Family still present with pt.  Offered support and comfort.  Comfort cart in room and additional chairs provided for family. Henriette CombsSarah Kelsye Loomer RN

## 2015-09-02 DEATH — deceased

## 2017-02-20 IMAGING — CR DG CHEST 2V
1 series · 2 of 2 positions shown · non-contrast
Comparison: Radiograph dated 06/19/2015

CLINICAL DATA: 86-year-old male with shortness of breath

EXAM:
CHEST  2 VIEW

[Series 1: w chest lat · 0.14mm/px · 2 of 2 slices shown]
[im 1/2]
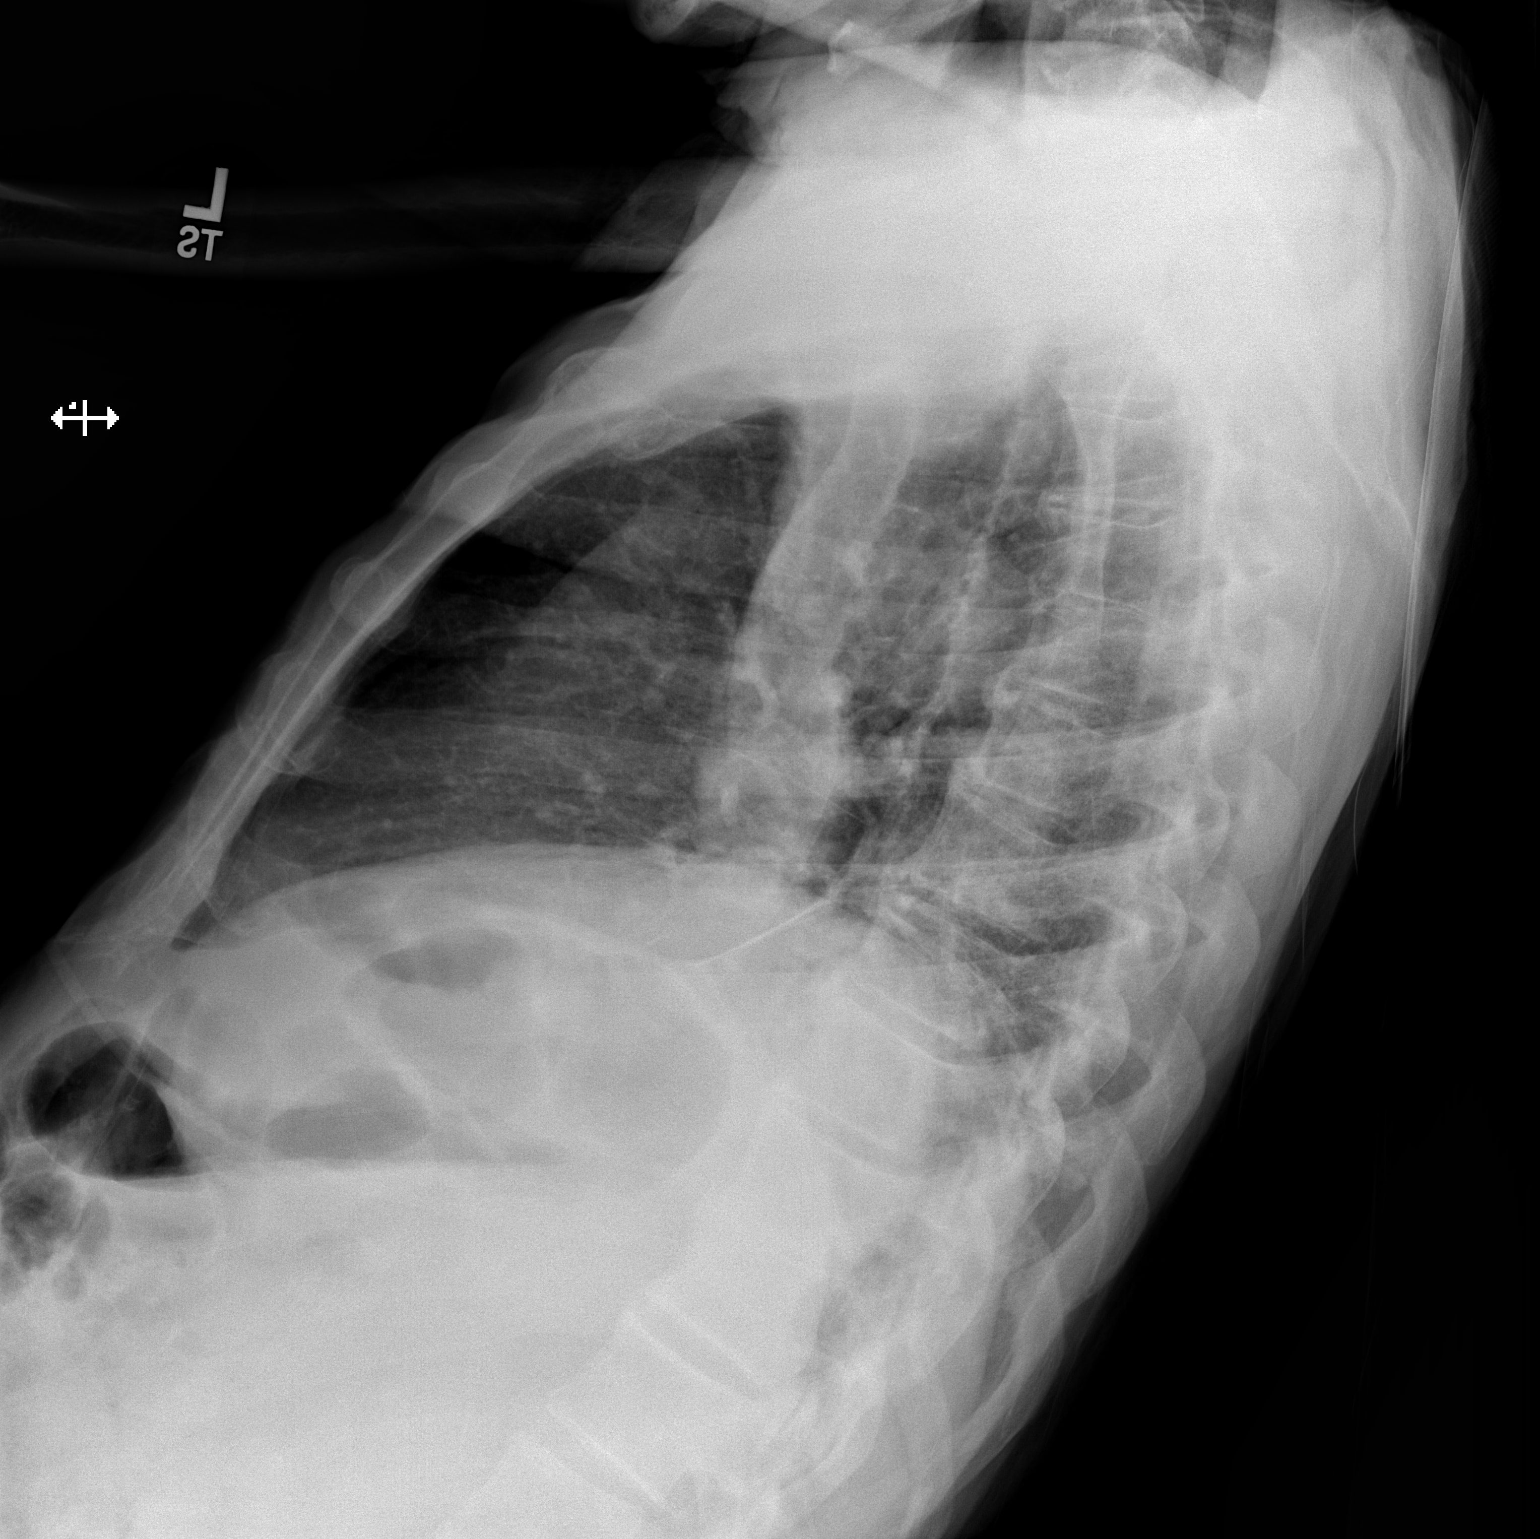
[im 2/2]
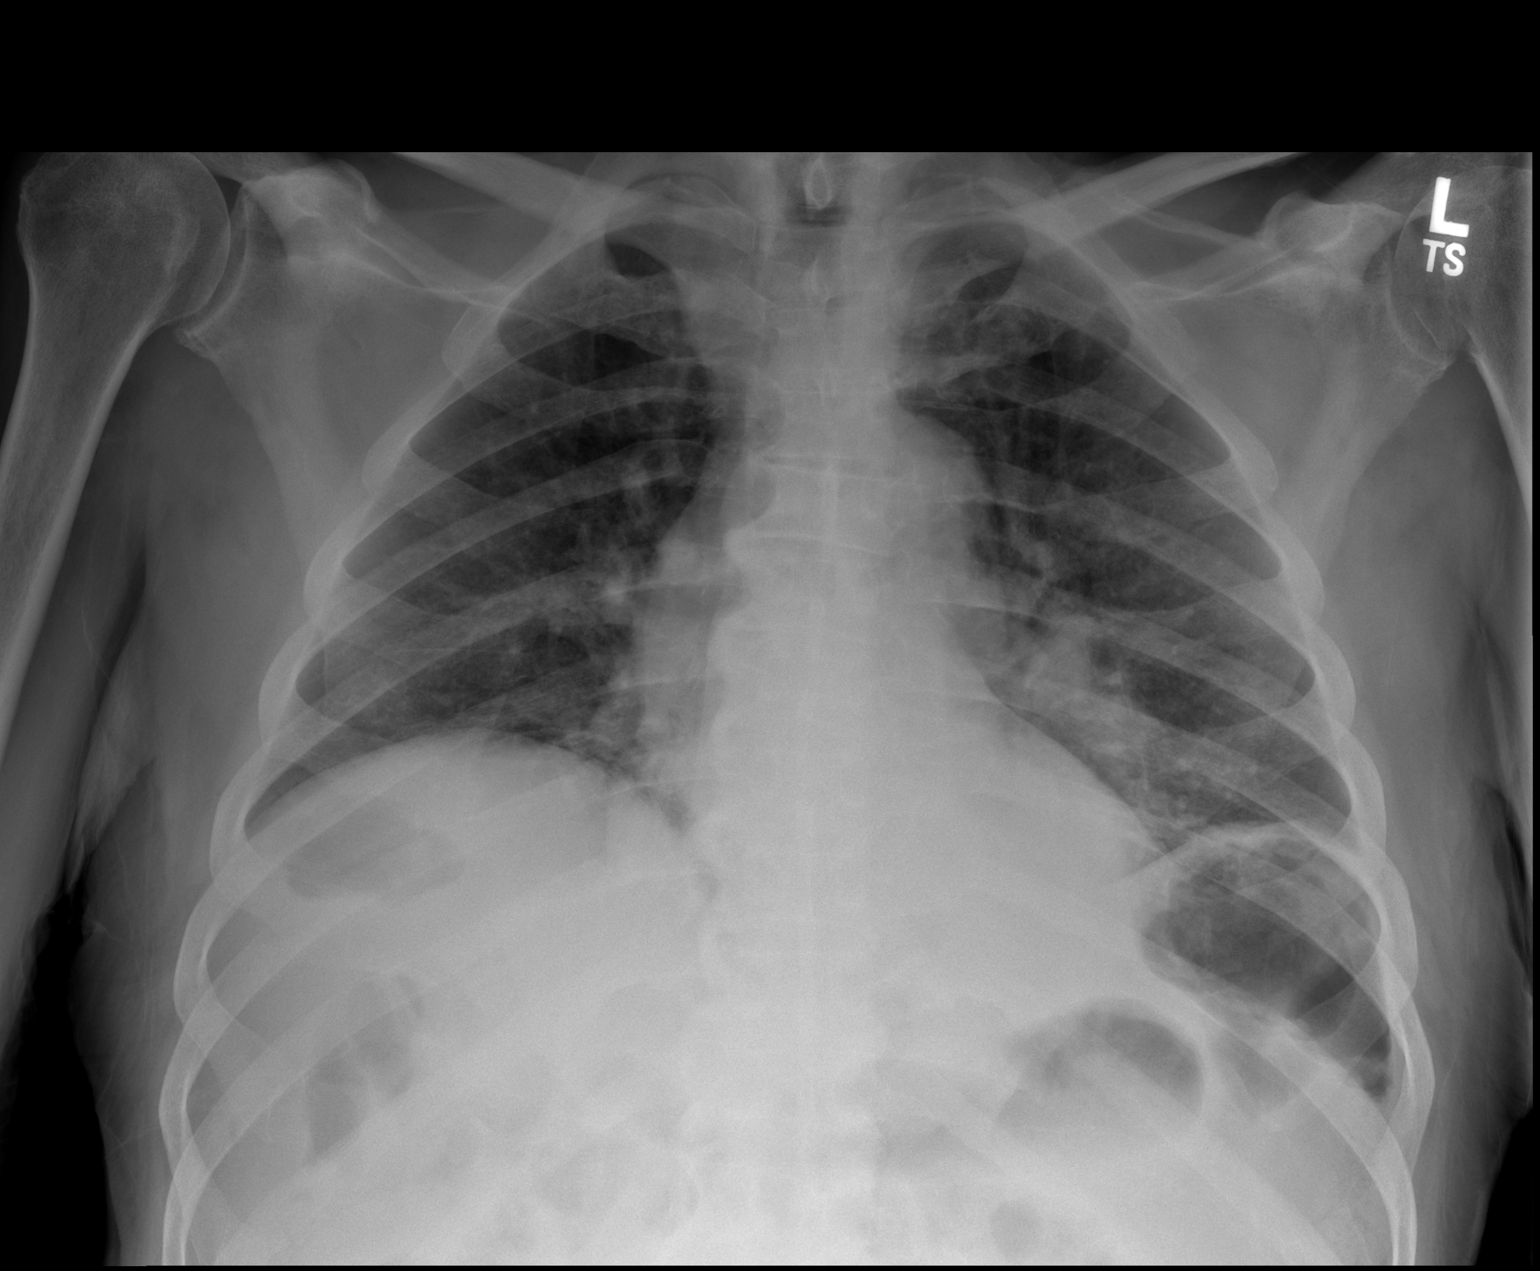

[2 of 2 positions shown; findings below may reference images not displayed]

FINDINGS: Two views of the chest demonstrate a focal area of increased density
in the left infrahilar region which may represent atelectatic
changes. Developing pneumonia is not excluded. Left lung base linear
and platelike atelectatic changes noted. The right lung is clear. No
pleural effusion or pneumothorax. The cardiac silhouette is within
normal limits. The thoracic aorta is tortuous. No acute osseous
pathology.
IMPRESSION: Focal left infrahilar atelectasis versus developing pneumonia.
Clinical correlation and follow-up recommended.

## 2017-03-12 IMAGING — DX DG CHEST 1V PORT
1 series · 1 of 1 positions shown · non-contrast
Comparison: 07/12/2015

CLINICAL DATA: Weakness

EXAM:
PORTABLE CHEST 1 VIEW

[chest ap]
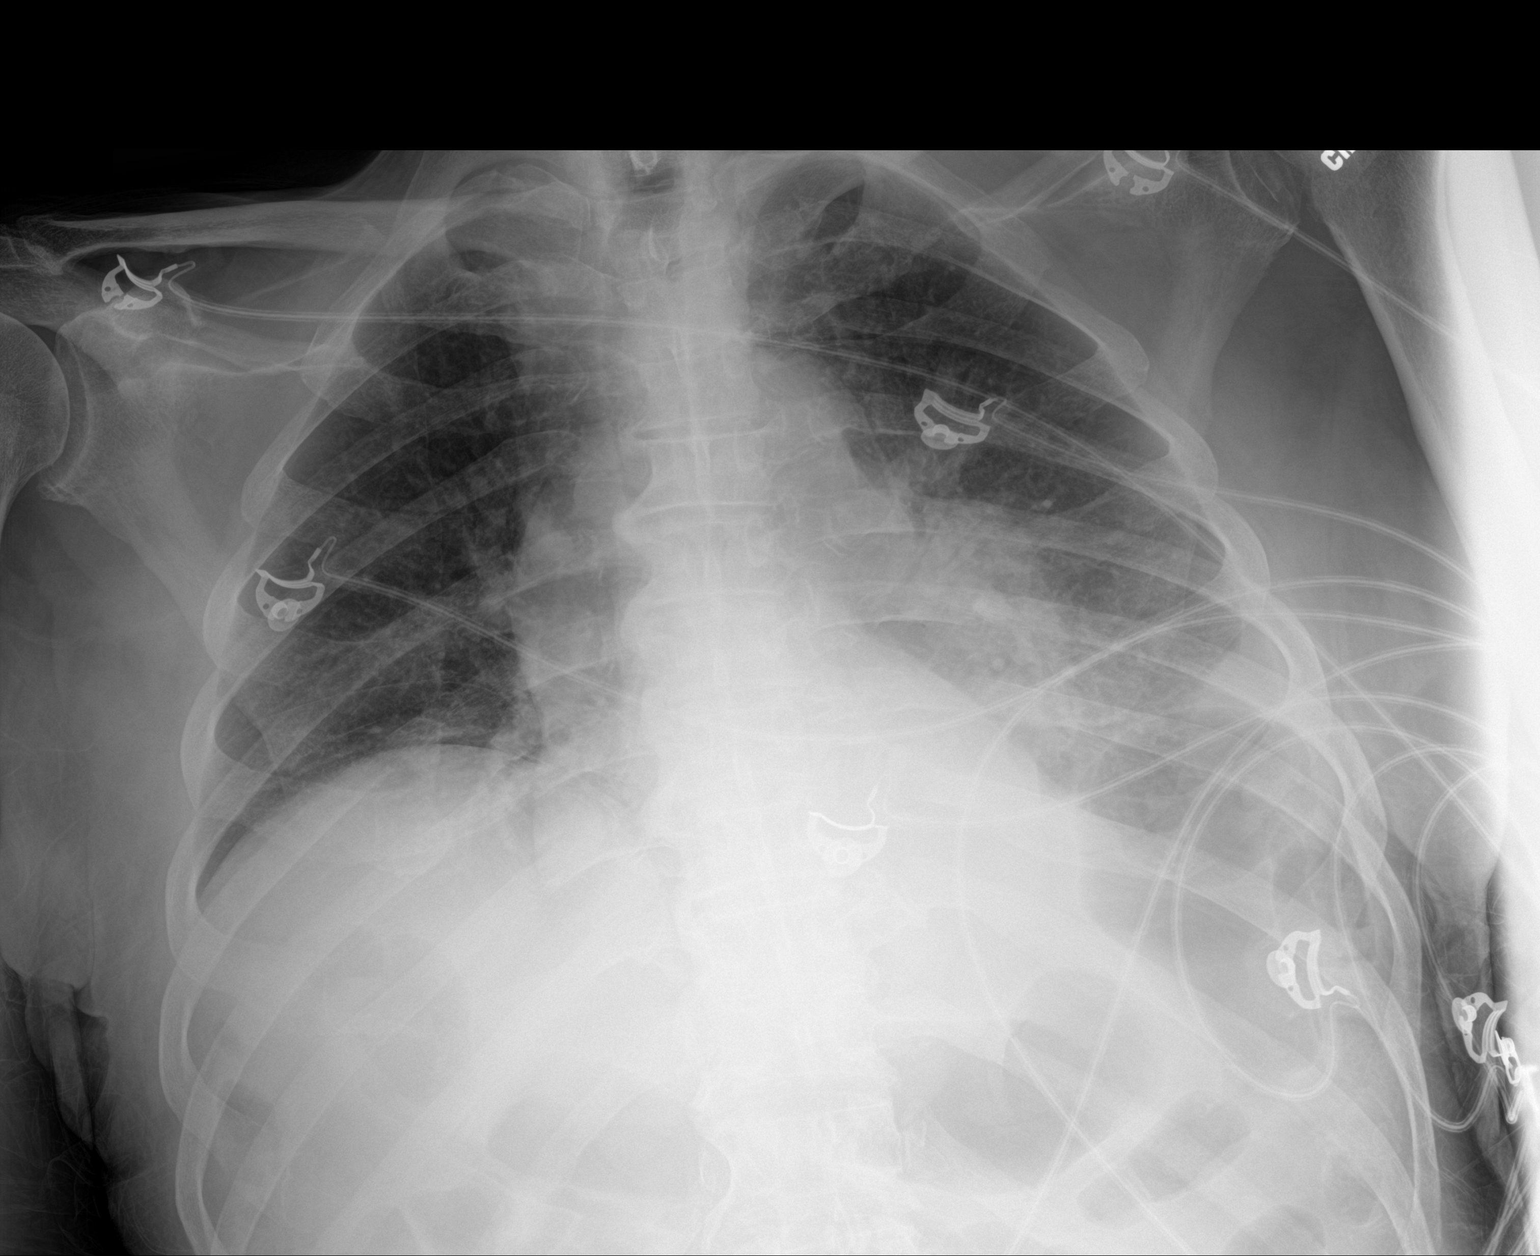

[1 of 1 positions shown; findings below may reference images not displayed]

FINDINGS: Low lung volumes. Left mid and lower lung zone consolidation. Normal
heart size. No pneumothorax.
IMPRESSION: Left lower lobe consolidation. Followup PA and lateral chest X-ray
is recommended in 3-4 weeks following trial of antibiotic therapy to
ensure resolution and exclude underlying malignancy.

## 2017-03-12 IMAGING — CR DG SHOULDER 2+V*L*
3 series · 3 of 3 positions shown · non-contrast
Comparison: No priors.

CLINICAL DATA: 86-year-old male complaining of weakness today.
History of trauma from a fall this morning complaining of left-sided
shoulder pain.

EXAM:
LEFT SHOULDER - 2+ VIEW

[shoulder grashey]
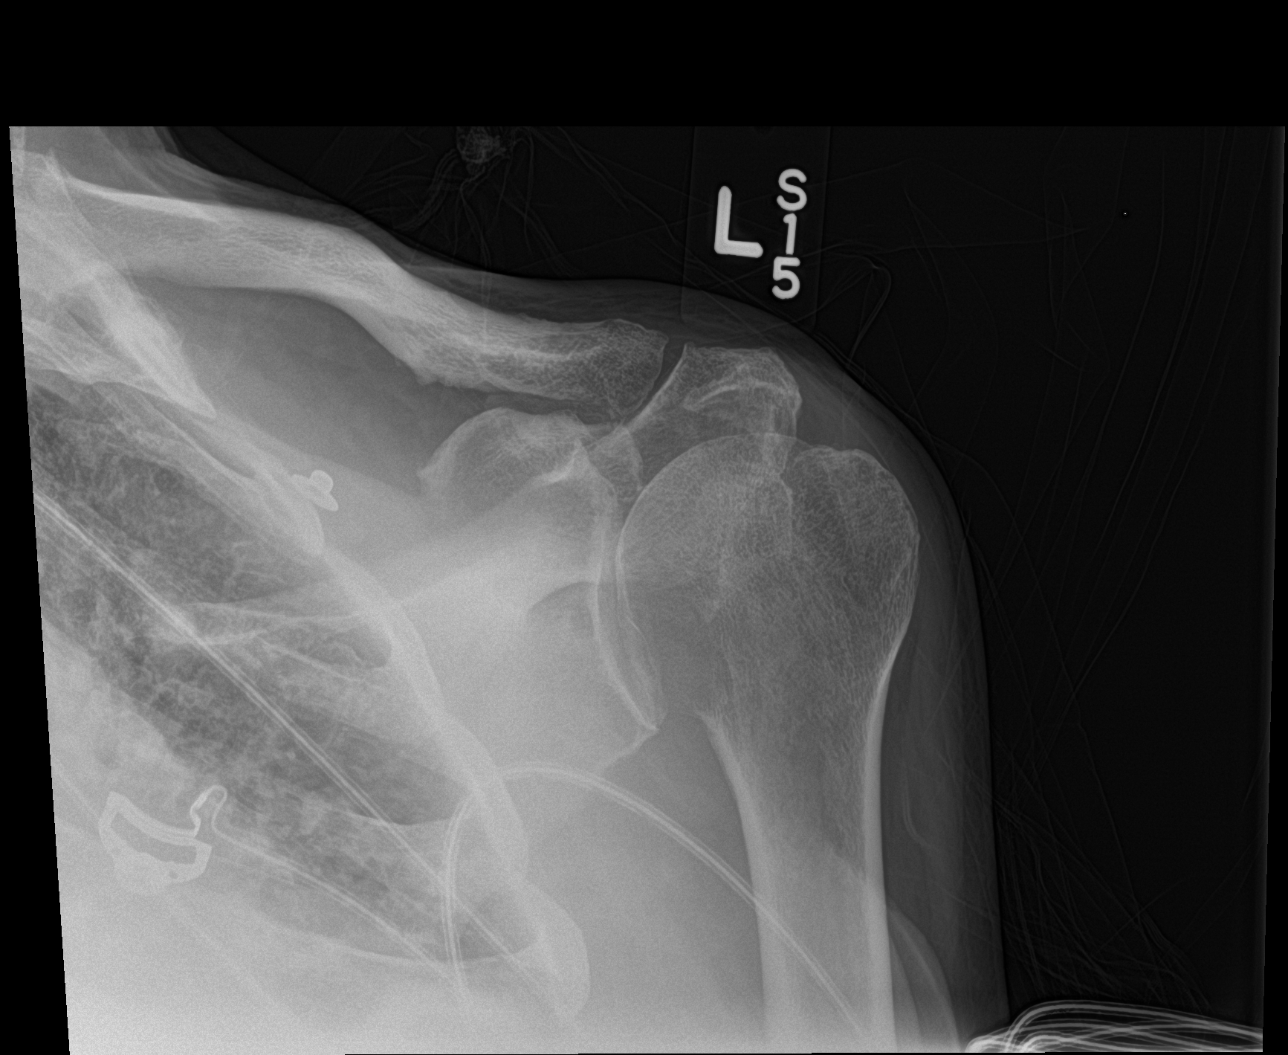

[shoulder y view]
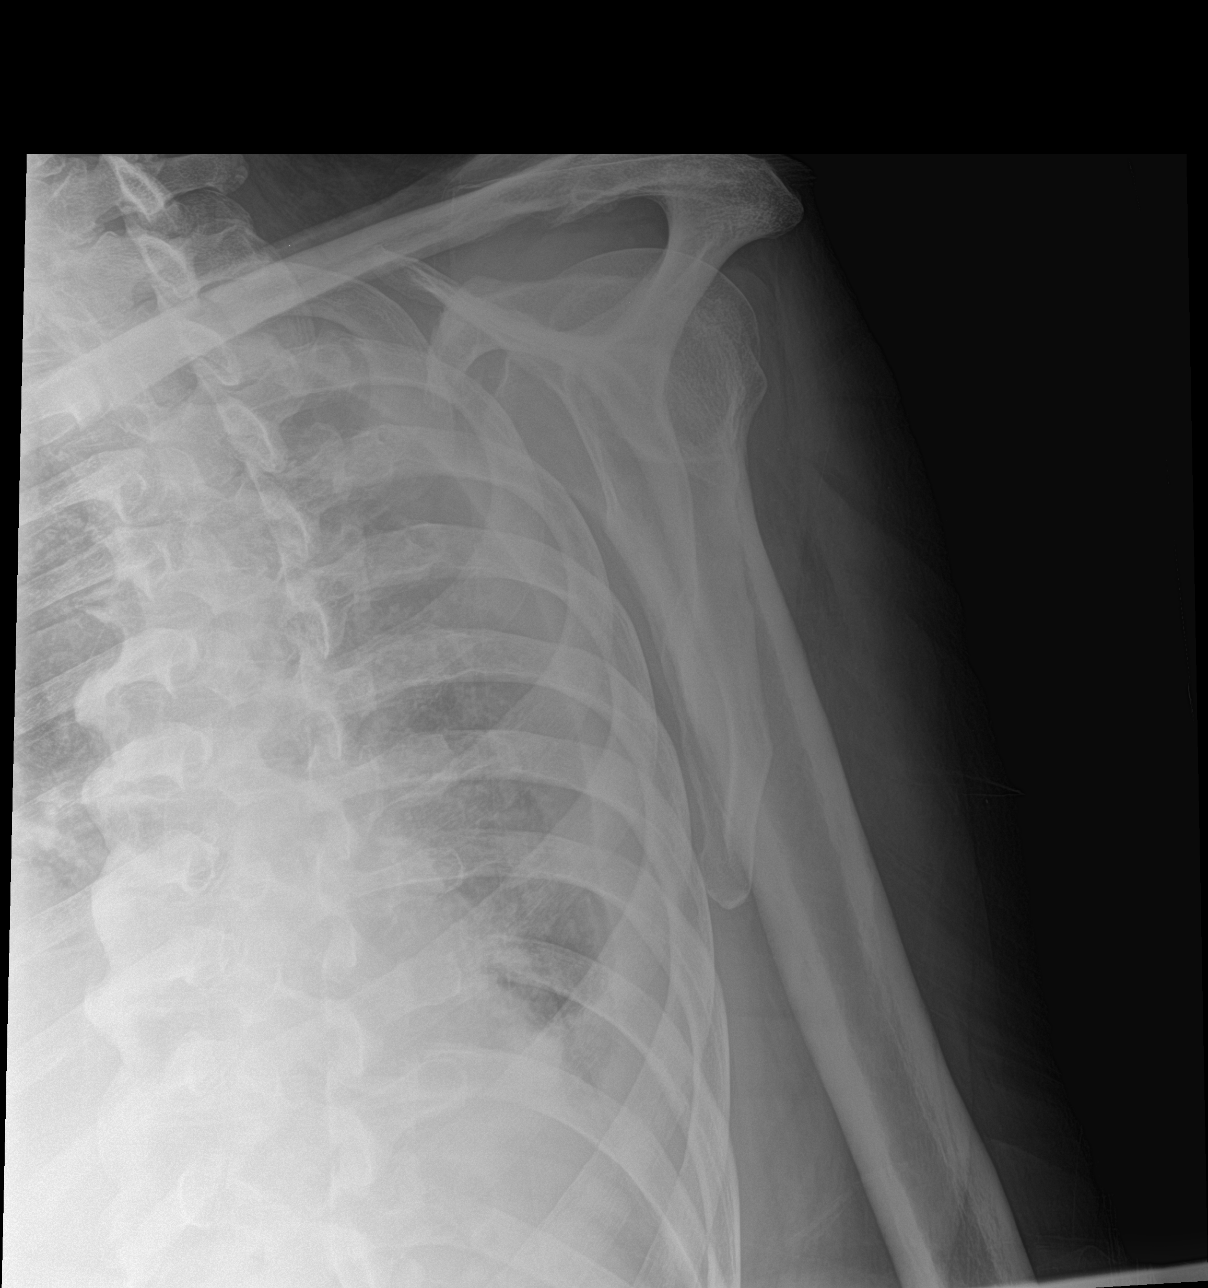

[shoulder axillary]
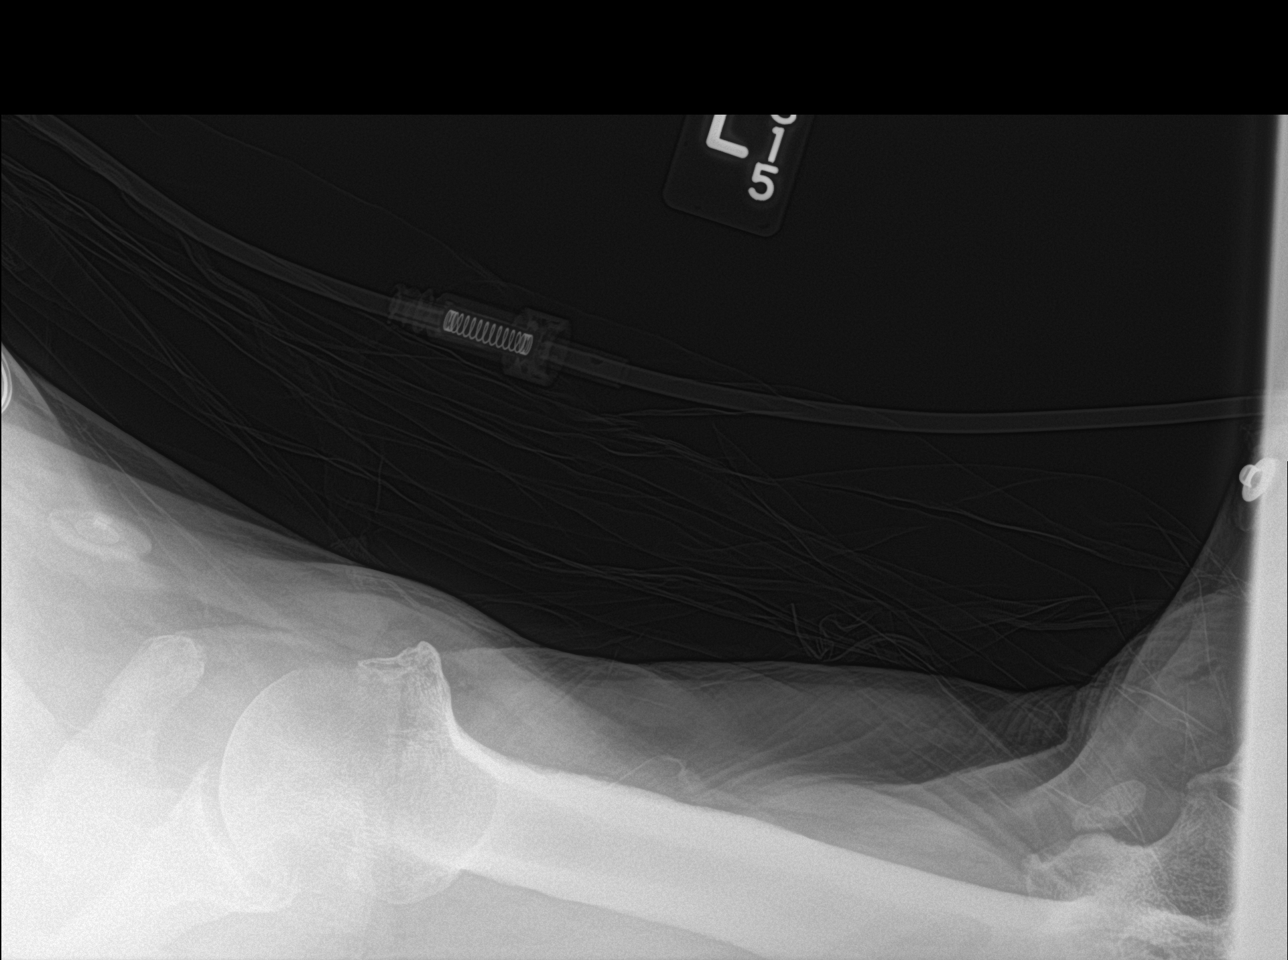

[3 of 3 positions shown; findings below may reference images not displayed]

FINDINGS: No acute displaced fracture. Mild degenerative changes of
osteoarthritis including joint space narrowing, subchondral
sclerosis and osteophyte formation are noted in the glenohumeral and
acromioclavicular joints. Left shoulder is located.
IMPRESSION: 1. No acute radiographic abnormality of the left shoulder.
2. Mild osteoarthritis in the glenohumeral and acromioclavicular
joints.

## 2018-10-19 ENCOUNTER — Other Ambulatory Visit: Payer: Self-pay
